# Patient Record
Sex: Male | Born: 1980 | Race: Black or African American | Hispanic: No | Marital: Married | Smoking: Current every day smoker
Health system: Southern US, Community
[De-identification: ages and names within clinical notes are randomized; demographics above are authoritative.]

## PROBLEM LIST (undated history)

## (undated) DIAGNOSIS — G35 Multiple sclerosis: Secondary | ICD-10-CM

---

## 2014-02-10 ENCOUNTER — Encounter: Payer: Self-pay | Admitting: Neurology

## 2014-02-10 ENCOUNTER — Ambulatory Visit (INDEPENDENT_AMBULATORY_CARE_PROVIDER_SITE_OTHER): Payer: 59 | Admitting: Neurology

## 2014-02-10 VITALS — BP 130/76 | HR 90 | Ht 76.0 in | Wt 207.7 lb

## 2014-02-10 DIAGNOSIS — R209 Unspecified disturbances of skin sensation: Secondary | ICD-10-CM

## 2014-02-10 DIAGNOSIS — R200 Anesthesia of skin: Secondary | ICD-10-CM

## 2014-02-10 DIAGNOSIS — R29818 Other symptoms and signs involving the nervous system: Secondary | ICD-10-CM | POA: Insufficient documentation

## 2014-02-10 LAB — COMPREHENSIVE METABOLIC PANEL
ALK PHOS: 79 U/L (ref 39–117)
ALT: 18 U/L (ref 0–53)
AST: 20 U/L (ref 0–37)
Albumin: 4.6 g/dL (ref 3.5–5.2)
BUN: 14 mg/dL (ref 6–23)
CO2: 26 mEq/L (ref 19–32)
Calcium: 9.3 mg/dL (ref 8.4–10.5)
Chloride: 101 mEq/L (ref 96–112)
Creat: 0.9 mg/dL (ref 0.50–1.35)
Glucose, Bld: 90 mg/dL (ref 70–99)
Potassium: 4.4 mEq/L (ref 3.5–5.3)
SODIUM: 137 meq/L (ref 135–145)
TOTAL PROTEIN: 7.3 g/dL (ref 6.0–8.3)
Total Bilirubin: 0.9 mg/dL (ref 0.2–1.2)

## 2014-02-10 LAB — CBC
HEMATOCRIT: 40 % (ref 39.0–52.0)
Hemoglobin: 14.4 g/dL (ref 13.0–17.0)
MCH: 30.8 pg (ref 26.0–34.0)
MCHC: 36 g/dL (ref 30.0–36.0)
MCV: 85.5 fL (ref 78.0–100.0)
PLATELETS: 196 10*3/uL (ref 150–400)
RBC: 4.68 MIL/uL (ref 4.22–5.81)
RDW: 13.3 % (ref 11.5–15.5)
WBC: 4.7 10*3/uL (ref 4.0–10.5)

## 2014-02-10 LAB — SEDIMENTATION RATE: Sed Rate: 1 mm/hr (ref 0–16)

## 2014-02-10 LAB — VITAMIN B12: Vitamin B-12: 868 pg/mL (ref 211–911)

## 2014-02-10 LAB — TSH: TSH: 2.942 u[IU]/mL (ref 0.350–4.500)

## 2014-02-10 NOTE — Progress Notes (Signed)
NEUROLOGY CONSULTATION NOTE  Travis Castillo MRN: [id number] DOB: Dec 05, 1980  Referring provider: Dr. Briscoe Deutscher Primary care provider: Dr. Melinda Crutch  Reason for consult:  Lhermitte's sign  Dear Dr Maceo Pro:  Thank you for your kind referral of Travis Castillo for consultation of the above symptoms. Although his history is well known to you, please allow me to reiterate it for the purpose of our medical record. Records and images were personally reviewed where available.  HISTORY OF PRESENT ILLNESS: This is a very pleasant 33 year old left-handed man with no significant past medical history who is very active in sports, presenting for evaluation of possible Lhermitte's sign and numbness.  Symptoms started around 2 months ago when he had numbness in the fingers of his right hand. After a week, it spread to his entire hand, then a week later, he noticed the numbness in his left hand.  The episode lasted 2 weeks.  Around a month ago, he noticed that every time he would flex his neck, he feels a sensation of vibration radiating from his neck down his back all the way to his feet.  He denies any neck pain, but describes a vibrating sensation in his neck and in his thighs.  Symptoms resolve when he lifts his head back to neutral position.  He has occasional numbness his toes.  He notes that over the past 3 months, he would get numbness in both legs when bending forward while sitting, which he attributed to positional changes.  No facial involvement.  He denies similar symptoms in the past.  He denies any vision changes/loss of vision in the past.  He is very active with basketball, weights, and cardio. He noticed that recently when he does push-ups, his bones feel a little more sore.  He denies any headaches, dizziness, blurred vision, diplopia, dysarthria, dysphagia, neck/back pain, bowel/bladder dysfunction, perineal numbness.  He denies any family history of similar symptoms.  He had a mild cold 4 months  ago but no recent infections, immunizations, or travels.  He had a cervical xray done on 01/30/14 which was normal.  PAST MEDICAL HISTORY: History reviewed. No pertinent past medical history.  PAST SURGICAL HISTORY: History reviewed. No pertinent past surgical history.  MEDICATIONS: No current outpatient prescriptions on file prior to visit.   No current facility-administered medications on file prior to visit.    ALLERGIES: No Known Allergies  FAMILY HISTORY: History reviewed. No pertinent family history.  SOCIAL HISTORY: History   Social History  . Marital Status: Married    Spouse Name: N/A    Number of Children: N/A  . Years of Education: N/A   Occupational History  . Not on file.   Social History Main Topics  . Smoking status: Former Smoker    Types: Cigarettes  . Smokeless tobacco: Not on file  . Alcohol Use: Yes     Comment: social  . Drug Use: No  . Sexual Activity: Not on file   Other Topics Concern  . Not on file   Social History Narrative  . No narrative on file    REVIEW OF SYSTEMS: Constitutional: No fevers, chills, or sweats, no generalized fatigue, change in appetite Eyes: No visual changes, double vision, eye pain Ear, nose and throat: No hearing loss, ear pain, nasal congestion, sore throat Cardiovascular: No chest pain, palpitations Respiratory:  No shortness of breath at rest or with exertion, wheezes GastrointestinaI: No nausea, vomiting, diarrhea, abdominal pain, fecal incontinence Genitourinary:  No dysuria, urinary  retention or frequency Musculoskeletal:  No neck pain, back pain Integumentary: No rash, pruritus, skin lesions Neurological: as above Psychiatric: No depression, insomnia, anxiety Endocrine: No palpitations, fatigue, diaphoresis, mood swings, change in appetite, change in weight, increased thirst Hematologic/Lymphatic:  No anemia, purpura, petechiae. Allergic/Immunologic: no itchy/runny eyes, nasal congestion, recent  allergic reactions, rashes  PHYSICAL EXAM: Filed Vitals:   02/10/14 0951  BP: 130/76  Pulse: 90   General: No acute distress Head:  Normocephalic/atraumatic Eyes: Fundoscopic exam shows bilateral sharp discs, no vessel changes, exudates, or hemorrhages Neck: supple, no paraspinal tenderness, full range of motion. Reproducible sensation down his spine with neck flexion Back: No paraspinal tenderness Heart: regular rate and rhythm Lungs: Clear to auscultation bilaterally. Vascular: No carotid bruits. Skin/Extremities: No rash, no edema Neurological Exam: Mental status: alert and oriented to person, place, and time, no dysarthria or aphasia, Fund of knowledge is appropriate.  Recent and remote memory are intact.  Attention and concentration are normal.    Able to name objects and repeat phrases. Cranial nerves: CN I: not tested CN II: pupils equal, round and reactive to light, visual fields intact, fundi unremarkable. CN III, IV, VI:  full range of motion, no nystagmus, no ptosis CN V: facial sensation intact CN VII: upper and lower face symmetric CN VIII: hearing intact to finger rub CN IX, X: gag intact, uvula midline CN XI: sternocleidomastoid and trapezius muscles intact CN XII: tongue midline Bulk & Tone: normal, no fasciculations. Motor: 5/5 throughout with no pronator drift. Sensation: intact to light touch, cold, pin, vibration and joint position sense.  No extinction to double simultaneous stimulation.  Romberg test negative Deep Tendon Reflexes: +2 throughout, no ankle clonus, negative Hoffman's sign Plantar responses: downgoing bilaterally Cerebellar: no incoordination on finger to nose, heel to shin. No dysdiadochokinesia Gait: narrow-based and steady, able to tandem walk adequately. Tremor: none  IMPRESSION: This is a 33 year old left-handed man with no significant past medication history, presenting with paresthesias that lasted for 2 weeks 2 months ago, now with  symptoms suggestive of Lhermitte's sign for the past month.  His neurological exam is normal.  The etiology of his symptoms is unclear, but at this age concerning for demyelinating disease.  MRI brain and cervical spine with and without contrast will be ordered to assess for underlying structural abnormality.  Bloodwork for CBC, CMP, ESR, ANA, TSH, B12, ACE will be ordered.  He knows to call our office for any change in symptoms and will follow-up after the tests.    Thank you for allowing me to participate in the care of this patient. Please do not hesitate to call for any questions or concerns.   Ellouise Newer, M.D.  CC: Dr. Harrington Challenger

## 2014-02-10 NOTE — Patient Instructions (Signed)
1. MRI brain with and without contrast 2. MRI cervical spine with and without contrast 3. Bloodwork for CBC, CMP, ESR, ANA, TSH, B12, ACE level 4. Follow-up after tests

## 2014-02-11 LAB — ANA: Anti Nuclear Antibody(ANA): NEGATIVE

## 2014-02-11 LAB — ANGIOTENSIN CONVERTING ENZYME: Angiotensin-Converting Enzyme: 23 U/L (ref 8–52)

## 2014-03-03 ENCOUNTER — Ambulatory Visit (HOSPITAL_COMMUNITY): Payer: 59

## 2014-03-03 ENCOUNTER — Ambulatory Visit (HOSPITAL_COMMUNITY)
Admission: RE | Admit: 2014-03-03 | Discharge: 2014-03-03 | Disposition: A | Discharge status: Home / Self Care | Payer: 59 | Source: Ambulatory Visit | Attending: Neurology | Admitting: Neurology

## 2014-03-03 DIAGNOSIS — R29818 Other symptoms and signs involving the nervous system: Secondary | ICD-10-CM

## 2014-03-03 DIAGNOSIS — G379 Demyelinating disease of central nervous system, unspecified: Secondary | ICD-10-CM | POA: Insufficient documentation

## 2014-03-03 DIAGNOSIS — R209 Unspecified disturbances of skin sensation: Secondary | ICD-10-CM | POA: Diagnosis present

## 2014-03-03 DIAGNOSIS — R200 Anesthesia of skin: Secondary | ICD-10-CM

## 2014-03-03 MED ORDER — GADOBENATE DIMEGLUMINE 529 MG/ML IV SOLN
20.0000 mL | Freq: Once | INTRAVENOUS | Status: AC
  Administered 2014-03-03: 20 mL via INTRAVENOUS

## 2014-03-04 ENCOUNTER — Telehealth: Payer: Self-pay | Admitting: *Deleted

## 2014-03-04 NOTE — Telephone Encounter (Signed)
Patient has been scheduled for Friday 31@ 1pm

## 2014-03-04 NOTE — Telephone Encounter (Signed)
Message copied by Samantha Crimes on Thu Mar 04, 2014  8:25 AM ------      Message from: Van Clines      Created: Thu Mar 04, 2014  8:09 AM      Regarding: pls schedule appt       Pls let patient know I would like to review his MRI scan with him personally, I can see him tomorrow either 1pm or 3:30pm. I will be out of the office the whole next week. Thanks ------

## 2014-03-05 ENCOUNTER — Encounter: Payer: Self-pay | Admitting: Neurology

## 2014-03-05 ENCOUNTER — Ambulatory Visit (INDEPENDENT_AMBULATORY_CARE_PROVIDER_SITE_OTHER): Payer: 59 | Admitting: Neurology

## 2014-03-05 VITALS — BP 130/70 | HR 79 | Ht 76.0 in | Wt 208.7 lb

## 2014-03-05 DIAGNOSIS — G379 Demyelinating disease of central nervous system, unspecified: Secondary | ICD-10-CM

## 2014-03-05 LAB — CBC WITH DIFFERENTIAL/PLATELET
BASOS ABS: 0.1 10*3/uL (ref 0.0–0.1)
BASOS PCT: 1 % (ref 0–1)
Eosinophils Absolute: 0.2 10*3/uL (ref 0.0–0.7)
Eosinophils Relative: 3 % (ref 0–5)
HCT: 42.3 % (ref 39.0–52.0)
Hemoglobin: 14.4 g/dL (ref 13.0–17.0)
Lymphocytes Relative: 54 % — ABNORMAL HIGH (ref 12–46)
Lymphs Abs: 2.9 10*3/uL (ref 0.7–4.0)
MCH: 30.6 pg (ref 26.0–34.0)
MCHC: 34 g/dL (ref 30.0–36.0)
MCV: 89.8 fL (ref 78.0–100.0)
Monocytes Absolute: 0.4 10*3/uL (ref 0.1–1.0)
Monocytes Relative: 8 % (ref 3–12)
NEUTROS PCT: 34 % — AB (ref 43–77)
Neutro Abs: 1.8 10*3/uL (ref 1.7–7.7)
PLATELETS: 216 10*3/uL (ref 150–400)
RBC: 4.71 MIL/uL (ref 4.22–5.81)
RDW: 13.2 % (ref 11.5–15.5)
WBC: 5.3 10*3/uL (ref 4.0–10.5)

## 2014-03-05 NOTE — Patient Instructions (Signed)
1. Bloodwork for PT/INR, VZV Ab, HIV, vitamin D, CBC with differential 2. Schedule Lumbar Puncture 3. Schedule thoracic MRI with and without contrast 4. Call our office for any change in symptoms

## 2014-03-05 NOTE — Progress Notes (Signed)
NEUROLOGY FOLLOW UP OFFICE NOTE  Travis Castillo [id number]  HISTORY OF PRESENT ILLNESS: I had the pleasure of seeing Travis Castillo in follow-up in the neurology clinic on 03/05/2014.  The patient was last seen 3 weeks ago when he presented with symptoms suggestive of Lhermitte's sign.  MRI brain and C-spine were done which I personally reviewed.  His MRI brain with and without contrast shows extensive periventricular and scattered subcortical white matter changes bilaterally. There is asymmetric white matter disease adjacent to the atrium of the left lateral ventricle.  There is extensive involvement of the colossoseptal margin. There is some diffusion signal abnormality within the area involving the right coronal radiata. A 7 x 6 x 2 mm focus of enhancement is associated with a white matter lesion in the anterior left frontal lobe. There are 2 punctate areas of enhancement associated with the area of signal abnormality in the right coronal radiata. MRI C-spine was normal, no abnormal signal or enhancement seen.  He presents today to discuss MRI findings.  Since his last visit, he continues to have positive Lhermitte's sign. Over the past 2 weeks, he has had intermittent cold sensation in his left foot, but when he touches it, foot is warm.  He denies any numbness, he notices it mostly in the sole of his foot when he steps on carpet. No focal weakness. He denies any vision changes, no loss of vision.  HPI:  This is a very pleasant 33 yo LH man with no significant past medical history who is very active in sports, who presented for evaluation of possible Lhermitte's sign and numbness. Symptoms started around May 2015 when he had numbness in the fingers of his right hand. After a week, it spread to his entire hand, then a week later, he noticed the numbness in his left hand. The episode lasted 2 weeks. Around June, he noticed that every time he would flex his neck, he feels a sensation of vibration  radiating from his neck down his back all the way to his feet. He denies any neck pain, but describes a vibrating sensation in his neck and in his thighs. Symptoms resolve when he lifts his head back to neutral position. He has occasional numbness his toes. He would get numbness in both legs when bending forward while sitting, which he attributed to positional changes. No facial involvement. He denies similar symptoms in the past. He denies any vision changes/loss of vision in the past. He is very active with basketball, weights, and cardio. He noticed that recently when he does push-ups, his bones feel a little more sore.   He denies any headaches, dizziness, blurred vision, diplopia, dysarthria, dysphagia, neck/back pain, bowel/bladder dysfunction, perineal numbness. He denies any family history of similar symptoms. He had a mild cold 4 months ago but no recent infections, immunizations, or travels.   Laboratory Data: Component     Latest Ref Rng 02/10/2014  Sodium     135 - 145 mEq/L 137  Potassium     3.5 - 5.3 mEq/L 4.4  Chloride     96 - 112 mEq/L 101  CO2     19 - 32 mEq/L 26  Glucose     70 - 99 mg/dL 90  BUN     6 - 23 mg/dL 14  Creatinine     4.090.50 - 1.35 mg/dL 8.110.90  Total Bilirubin     0.2 - 1.2 mg/dL 0.9  Alkaline Phosphatase     39 - 117  U/L 79  AST     0 - 37 U/L 20  ALT     0 - 53 U/L 18  Total Protein     6.0 - 8.3 g/dL 7.3  Albumin     3.5 - 5.2 g/dL 4.6  Calcium     8.4 - 10.5 mg/dL 9.3  WBC     4.0 - 16.1 K/uL 4.7  RBC     4.22 - 5.81 MIL/uL 4.68  Hemoglobin     13.0 - 17.0 g/dL 09.6  HCT     04.5 - 40.9 % 40.0  MCV     78.0 - 100.0 fL 85.5  MCH     26.0 - 34.0 pg 30.8  MCHC     30.0 - 36.0 g/dL 81.1  RDW     91.4 - 78.2 % 13.3  Platelets     150 - 400 K/uL 196  ANA     NEGATIVE NEG  TSH     0.350 - 4.500 uIU/mL 2.942  Vitamin B-12     211 - 911 pg/mL 868  Sed Rate     0 - 16 mm/hr 1  Angiotensin-Converting Enzyme     8 - 52 U/L 23     PAST MEDICAL HISTORY: History reviewed. No pertinent past medical history.  MEDICATIONS: No current outpatient prescriptions on file prior to visit.   No current facility-administered medications on file prior to visit.    ALLERGIES: No Known Allergies  FAMILY HISTORY: History reviewed. No pertinent family history.  SOCIAL HISTORY: History   Social History  . Marital Status: Married    Spouse Name: N/A    Number of Children: N/A  . Years of Education: N/A   Occupational History  . Not on file.   Social History Main Topics  . Smoking status: Former Smoker    Types: Cigarettes  . Smokeless tobacco: Not on file  . Alcohol Use: Yes     Comment: social  . Drug Use: No  . Sexual Activity: Not on file   Other Topics Concern  . Not on file   Social History Narrative  . No narrative on file    REVIEW OF SYSTEMS: Constitutional: No fevers, chills, or sweats, no generalized fatigue, change in appetite Eyes: No visual changes, double vision, eye pain Ear, nose and throat: No hearing loss, ear pain, nasal congestion, sore throat Cardiovascular: No chest pain, palpitations Respiratory:  No shortness of breath at rest or with exertion, wheezes GastrointestinaI: No nausea, vomiting, diarrhea, abdominal pain, fecal incontinence Genitourinary:  No dysuria, urinary retention or frequency Musculoskeletal:  No neck pain, back pain Integumentary: No rash, pruritus, skin lesions Neurological: as above Psychiatric: No depression, insomnia, anxiety Endocrine: No palpitations, fatigue, diaphoresis, mood swings, change in appetite, change in weight, increased thirst Hematologic/Lymphatic:  No anemia, purpura, petechiae. Allergic/Immunologic: no itchy/runny eyes, nasal congestion, recent allergic reactions, rashes  PHYSICAL EXAM: Filed Vitals:   03/05/14 1313  BP: 130/70  Pulse: 79   General: No acute distress Head:  Normocephalic/atraumatic Neck: supple, no paraspinal  tenderness, full range of motion Heart:  Regular rate and rhythm Lungs:  Clear to auscultation bilaterally Back: No paraspinal tenderness Skin/Extremities: No rash, no edema Neurological Exam: alert and oriented to person, place, and time. No aphasia or dysarthria. Fund of knowledge is appropriate.  Recent and remote memory are intact.  Attention and concentration are normal.    Able to name objects and repeat phrases. Cranial nerves: Pupils equal, round,  reactive to light.  Fundoscopic exam unremarkable, no papilledema. Extraocular movements intact with no nystagmus. Visual fields full. Facial sensation intact. No facial asymmetry. Tongue, uvula, palate midline.  Motor: Bulk and tone normal, muscle strength 5/5 throughout with no pronator drift.  Sensation to light touch, temperature and vibration intact.  No extinction to double simultaneous stimulation.  Deep tendon reflexes 2+ throughout, toes downgoing.  Finger to nose testing intact.  Gait narrow-based and steady, able to tandem walk adequately.  Romberg negative.  IMPRESSION: This is a very pleasant 33 yo LH man with no significant past medication history, who presented with paresthesias that lasted for 2 weeks last May 2015, then had symptoms suggestive of Lhermitte's sign since June.  His neurological exam is normal.  His MRI brain shows extensive white matter changes bilaterally and in the callososeptal region, with small foci of abnormal enhancement in the left frontal and right corona radiata, concerning for demyelinating disease.  MRI C-spine normal. We had an extensive discussion regarding the MRI findings, further evaluation with MRI T-spine for spinal cord involvement, lumbar puncture to send for oligoclonal bands, CSF ACE, VDRL, HIV, and if T-spine abnormal will send for NMO Ab. Bloodwork for CBC with differential, vitamin D, HIV, VZV Ab, PT/INR will be ordered. He will be referred for baseline ophthalmology exam. We discussed multiple  sclerosis and treatment options. He was given information regarding the different medications and after LP and MRI T-spine done, we will plan to start medication.  The foci of abnormal enhancement are punctate and small at this time, his exam is normal, hold off on IV steroids for now. He knows to call our office for any change in symptoms, we discussed the use of IV steroids for flares.  He expressed understanding and will follow-up in 1 month.  Thank you for allowing me to participate in his care.  Please do not hesitate to call for any questions or concerns.  The duration of this appointment visit was 15 minutes of face-to-face time with the patient.  Greater than 50% of this time was spent in counseling, explanation of diagnosis, planning of further management, and coordination of care.   Patrcia Dolly, M.D.   CC: Dr. Tenny Craw

## 2014-03-06 LAB — PROTIME-INR
INR: 1.03 (ref <1.50)
PROTHROMBIN TIME: 13.5 s (ref 11.6–15.2)

## 2014-03-06 LAB — HIV ANTIBODY (ROUTINE TESTING W REFLEX): HIV: NONREACTIVE

## 2014-03-06 LAB — VITAMIN D 25 HYDROXY (VIT D DEFICIENCY, FRACTURES): VIT D 25 HYDROXY: 40 ng/mL (ref 30–89)

## 2014-03-06 LAB — VARICELLA ZOSTER ANTIBODY, IGG: Varicella IgG: 2456 Index — ABNORMAL HIGH (ref <135.00)

## 2014-03-09 ENCOUNTER — Encounter: Payer: Self-pay | Admitting: Neurology

## 2014-03-09 ENCOUNTER — Other Ambulatory Visit: Payer: Self-pay | Admitting: *Deleted

## 2014-03-09 DIAGNOSIS — G379 Demyelinating disease of central nervous system, unspecified: Secondary | ICD-10-CM

## 2014-03-12 ENCOUNTER — Ambulatory Visit (HOSPITAL_COMMUNITY)
Admission: RE | Admit: 2014-03-12 | Discharge: 2014-03-12 | Disposition: A | Discharge status: Home / Self Care | Payer: 59 | Source: Ambulatory Visit | Attending: Neurology | Admitting: Neurology

## 2014-03-12 DIAGNOSIS — G379 Demyelinating disease of central nervous system, unspecified: Secondary | ICD-10-CM

## 2014-03-12 DIAGNOSIS — M542 Cervicalgia: Secondary | ICD-10-CM | POA: Insufficient documentation

## 2014-03-12 DIAGNOSIS — R209 Unspecified disturbances of skin sensation: Secondary | ICD-10-CM | POA: Insufficient documentation

## 2014-03-12 DIAGNOSIS — M546 Pain in thoracic spine: Secondary | ICD-10-CM | POA: Insufficient documentation

## 2014-03-12 MED ORDER — GADOBENATE DIMEGLUMINE 529 MG/ML IV SOLN
20.0000 mL | Freq: Once | INTRAVENOUS | Status: AC | PRN
  Administered 2014-03-12: 20 mL via INTRAVENOUS

## 2014-03-15 ENCOUNTER — Other Ambulatory Visit: Payer: Self-pay | Admitting: *Deleted

## 2014-03-15 ENCOUNTER — Ambulatory Visit: Payer: 59 | Admitting: Neurology

## 2014-03-15 DIAGNOSIS — G35 Multiple sclerosis: Secondary | ICD-10-CM

## 2014-03-17 ENCOUNTER — Other Ambulatory Visit: Payer: Self-pay | Admitting: *Deleted

## 2014-03-17 DIAGNOSIS — G35 Multiple sclerosis: Secondary | ICD-10-CM

## 2014-03-26 DIAGNOSIS — Z9852 Vasectomy status: Secondary | ICD-10-CM | POA: Insufficient documentation

## 2014-04-05 ENCOUNTER — Telehealth: Payer: Self-pay | Admitting: Family Medicine

## 2014-04-05 NOTE — Telephone Encounter (Signed)
Patient returned my call. Needed to know if he has scheduled LP, he states that he has not. I told him that he needed to call [facility] to have that scheduled and Dr. Karel Jarvis wanted to see him after he has had LP. He does have a f/u scheduled for 9/2 which I will cancel. Told patient that we needed to see him about a week after he has LP. I gave him the # to [facility]. He states that he wanted to have LP done and he will call to schedule. Orders have already been placed and are in EPIC.

## 2014-04-07 ENCOUNTER — Ambulatory Visit: Payer: 59 | Admitting: Neurology

## 2014-04-09 ENCOUNTER — Ambulatory Visit
Admission: RE | Admit: 2014-04-09 | Discharge: 2014-04-09 | Disposition: A | Discharge status: Home / Self Care | Payer: 59 | Source: Ambulatory Visit | Attending: Neurology | Admitting: Neurology

## 2014-04-09 ENCOUNTER — Other Ambulatory Visit (HOSPITAL_COMMUNITY)
Admission: RE | Admit: 2014-04-09 | Discharge: 2014-04-09 | Disposition: A | Discharge status: Home / Self Care | Payer: 59 | Source: Ambulatory Visit | Attending: Neurology | Admitting: Neurology

## 2014-04-09 VITALS — BP 120/71 | HR 73

## 2014-04-09 DIAGNOSIS — G35 Multiple sclerosis: Secondary | ICD-10-CM

## 2014-04-09 LAB — GRAM STAIN

## 2014-04-09 LAB — GLUCOSE, CSF: Glucose, CSF: 70 mg/dL (ref 43–76)

## 2014-04-09 LAB — PROTEIN, CSF: TOTAL PROTEIN, CSF: 22 mg/dL (ref 15–45)

## 2014-04-09 LAB — CSF CELL COUNT WITH DIFFERENTIAL
RBC COUNT CSF: 6 /mm3 — AB
Tube #: 3
WBC, CSF: 2 /mm3 (ref 0–5)

## 2014-04-09 NOTE — Discharge Instructions (Signed)

## 2014-04-09 NOTE — Progress Notes (Signed)
Two tiger-topped tubes of blood drawn from right [facility] space without difficulty for LP labs; site unremarkable.  jkl

## 2014-04-13 ENCOUNTER — Other Ambulatory Visit: Payer: Self-pay | Admitting: Family Medicine

## 2014-04-13 ENCOUNTER — Telehealth: Payer: Self-pay

## 2014-04-13 DIAGNOSIS — G971 Other reaction to spinal and lumbar puncture: Secondary | ICD-10-CM

## 2014-04-13 LAB — CSF CULTURE

## 2014-04-13 LAB — CSF CULTURE W GRAM STAIN: Culture: NO GROWTH

## 2014-04-13 LAB — ANGIOTENSIN CONVERTING ENZYME, CSF: ANGIO CONVERT ENZYME: 10 U/L (ref <=15)

## 2014-04-13 LAB — VDRL, CSF: SYPHILIS VDRL QUANT CSF: NONREACTIVE

## 2014-04-13 NOTE — Telephone Encounter (Signed)
Patient called with complaint of positional headache since recovering from the 24 hours of strict bedrest after LP here 04/09/14.  We discussed epidural blood patch versus continued bedrest with forcing fluids.  We decided I would obtain order for blood patch and call him tomorrow to schedule the procedure for next week (his wife is due to deliver a baby any day), and he would do the bedrest and fluids and tolerate headache and/or hope it goes away before needing the blood patch.  jkl

## 2014-04-14 LAB — CSF IGG: IgG, CSF: 6.3 mg/dL (ref 0.8–7.7)

## 2014-04-15 LAB — VARICELLA-ZOSTER BY PCR: Varicella-Zoster, PCR: NOT DETECTED

## 2014-04-16 LAB — OLIGOCLONAL BANDS, CSF + SERM

## 2014-04-17 LAB — HSV(HERPES SMPLX VRS)ABS-I+II(IGG)-CSF

## 2014-04-19 ENCOUNTER — Telehealth: Payer: Self-pay | Admitting: Family Medicine

## 2014-04-19 NOTE — Telephone Encounter (Signed)
Called patient to make aware of f/u appt needed to discuss LP results. Appt made for Thursday (9/17) @ 8:30 am.

## 2014-04-19 NOTE — Telephone Encounter (Signed)
Message copied by Franciso Bend on Mon Apr 19, 2014 11:59 AM ------      Message from: Van Clines      Created: Mon Apr 19, 2014 10:22 AM      Regarding: pls sched f/u       Pls let him know to schedule f/u to discuss results, thanks ------

## 2014-04-22 ENCOUNTER — Encounter: Payer: Self-pay | Admitting: Neurology

## 2014-04-22 ENCOUNTER — Ambulatory Visit (INDEPENDENT_AMBULATORY_CARE_PROVIDER_SITE_OTHER): Payer: 59 | Admitting: Neurology

## 2014-04-22 VITALS — BP 138/80 | HR 86 | Resp 18 | Ht 76.0 in | Wt 207.0 lb

## 2014-04-22 DIAGNOSIS — G35 Multiple sclerosis: Secondary | ICD-10-CM | POA: Insufficient documentation

## 2014-04-22 NOTE — Progress Notes (Signed)
NEUROLOGY FOLLOW UP OFFICE NOTE  Travis Castillo [id number]  HISTORY OF PRESENT ILLNESS: I had the pleasure of seeing Travis Castillo in follow-up in the neurology clinic on 04/22/2014.  The patient was last seen 2 months ago during which we discussed MRI brain showing findings concerning for MS. MRI C-spine normal.  He had presented with symptoms suggestive of Lhermitte's sign and numbness in his upper extremities.  He reports the sensation going down his spine and the numbness have not been noticeable in the past 2 weeks. No new symptoms. He denies any headaches, fatigue, vision changes, weakness.  In the interim, he had an MRI thoracic spine which I personally reviewed that is normal. He had a lumbar puncture which showed >5 oligoclonal bands. All other studies negative as noted below.  He did have a post-LP headache for several days but did not proceed with blood patch.  HPI:  This is a very pleasant 33 yo LH man with no significant past medical history who is very active in sports, who presented for evaluation of possible Lhermitte's sign and numbness. Symptoms started around May 2015 when he had numbness in the fingers of his right hand. After a week, it spread to his entire hand, then a week later, he noticed the numbness in his left hand. The episode lasted 2 weeks. Around June, he noticed that every time he would flex his neck, he feels a sensation of vibration radiating from his neck down his back all the way to his feet. He denies any neck pain, but describes a vibrating sensation in his neck and in his thighs. Symptoms resolve when he lifts his head back to neutral position. He has occasional numbness his toes. He would get numbness in both legs when bending forward while sitting, which he attributed to positional changes. No facial involvement. He denies similar symptoms in the past. He denies any vision changes/loss of vision in the past. He is very active with basketball, weights, and cardio. He  noticed that recently when he does push-ups, his bones feel a little more sore.   Diagnostic Data: MRI brain with and without contrast shows extensive periventricular and scattered subcortical white matter changes bilaterally. There is asymmetric white matter disease adjacent to the atrium of the left lateral ventricle. There is extensive involvement of the colossoseptal margin. There is some diffusion signal abnormality within the area involving the right coronal radiata. A 7 x 6 x 2 mm focus of enhancement is associated with a white matter lesion in the anterior left frontal lobe. There are 2 punctate areas of enhancement associated with the area of signal abnormality in the right coronal radiata. MRI C-spine and T-spin normal, no abnormal signal or enhancement seen.   Laboratory Data: CSF showed WBC 2, RBC 6, protein 22, glucose 70.  >5 oligoclonal bands Normal IgG index 6.3 VDRL nonreactive VZV PCR, HSV, ACE negative Gram stain, culture, fungal culture negative Component     Latest Ref Rng 02/10/2014 03/05/2014  WBC     4.0 - 10.5 K/uL  5.3  RBC     4.22 - 5.81 MIL/uL  4.71  Hemoglobin     13.0 - 17.0 g/dL  14.4  HCT     39.0 - 52.0 %  42.3  MCV     78.0 - 100.0 fL  89.8  MCH     26.0 - 34.0 pg  30.6  MCHC     30.0 - 36.0 g/dL  34.0  RDW  11.5 - 15.5 %  13.2  Platelets     150 - 400 K/uL  216  Neutrophils Relative %     43 - 77 %  34 (L)  NEUT#     1.7 - 7.7 K/uL  1.8  Lymphocytes Relative     12 - 46 %  54 (H)  Lymphocytes Absolute     0.7 - 4.0 K/uL  2.9  Monocytes Relative     3 - 12 %  8  Monocytes Absolute     0.1 - 1.0 K/uL  0.4  Eosinophils Relative     0 - 5 %  3  Eosinophils Absolute     0.0 - 0.7 K/uL  0.2  Basophils Relative     0 - 1 %  1  Basophils Absolute     0.0 - 0.1 K/uL  0.1  Smear Review       Criteria for review not met  Sodium     135 - 145 mEq/L 137   Potassium     3.5 - 5.3 mEq/L 4.4   Chloride     96 - 112 mEq/L 101   CO2     19  - 32 mEq/L 26   Glucose     70 - 99 mg/dL 90   BUN     6 - 23 mg/dL 14   Creatinine     0.50 - 1.35 mg/dL 0.90   Total Bilirubin     0.2 - 1.2 mg/dL 0.9   Alkaline Phosphatase     39 - 117 U/L 79   AST     0 - 37 U/L 20   ALT     0 - 53 U/L 18   Total Protein     6.0 - 8.3 g/dL 7.3   Albumin     3.5 - 5.2 g/dL 4.6   Calcium     8.4 - 10.5 mg/dL 9.3   ANA     NEGATIVE NEG   TSH     0.350 - 4.500 uIU/mL 2.942   Vitamin B-12     211 - 911 pg/mL 868   Sed Rate     0 - 16 mm/hr 1   Angiotensin-Converting Enzyme     8 - 52 U/L 23   Varicella     <135.00 Index  2456.00 (H)  HIV     NONREACTIVE  NONREACTIVE  Vit D, 25-Hydroxy     30 - 89 ng/mL  40   PAST MEDICAL HISTORY: No past medical history on file.  MEDICATIONS: No current outpatient prescriptions on file prior to visit.   No current facility-administered medications on file prior to visit.    ALLERGIES: No Known Allergies  FAMILY HISTORY: No family history on file.  SOCIAL HISTORY: History   Social History  . Marital Status: Married    Spouse Name: N/A    Number of Children: N/A  . Years of Education: N/A   Occupational History  . Not on file.   Social History Main Topics  . Smoking status: Former Smoker    Types: Cigarettes  . Smokeless tobacco: Not on file  . Alcohol Use: Yes     Comment: social  . Drug Use: No  . Sexual Activity: Not on file   Other Topics Concern  . Not on file   Social History Narrative  . No narrative on file    REVIEW OF SYSTEMS: Constitutional: No fevers, chills, or sweats, no  generalized fatigue, change in appetite Eyes: No visual changes, double vision, eye pain Ear, nose and throat: No hearing loss, ear pain, nasal congestion, sore throat Cardiovascular: No chest pain, palpitations Respiratory:  No shortness of breath at rest or with exertion, wheezes GastrointestinaI: No nausea, vomiting, diarrhea, abdominal pain, fecal incontinence Genitourinary:  No  dysuria, urinary retention or frequency Musculoskeletal:  No neck pain, back pain Integumentary: No rash, pruritus, skin lesions Neurological: as above Psychiatric: No depression, insomnia, anxiety Endocrine: No palpitations, fatigue, diaphoresis, mood swings, change in appetite, change in weight, increased thirst Hematologic/Lymphatic:  No anemia, purpura, petechiae. Allergic/Immunologic: no itchy/runny eyes, nasal congestion, recent allergic reactions, rashes  PHYSICAL EXAM: Filed Vitals:   04/22/14 0825  BP: 138/80  Pulse: 86  Resp: 18   General: No acute distress Head:  Normocephalic/atraumatic Neck: supple, no paraspinal tenderness, full range of motion Heart:  Regular rate and rhythm Lungs:  Clear to auscultation bilaterally Back: No paraspinal tenderness Skin/Extremities: No rash, no edema Neurological Exam: alert and oriented to person, place, and time. No aphasia or dysarthria. Fund of knowledge is appropriate.  Recent and remote memory are intact.  Attention and concentration are normal.    Able to name objects and repeat phrases. Cranial nerves: Pupils equal, round, reactive to light.  Fundoscopic exam unremarkable, no papilledema. Extraocular movements intact with no nystagmus. Visual fields full. Facial sensation intact. No facial asymmetry. Tongue, uvula, palate midline.  Motor: Bulk and tone normal, muscle strength 5/5 throughout with no pronator drift.  Sensation to light touch, temperature and vibration intact.  No extinction to double simultaneous stimulation.  Deep tendon reflexes 2+ throughout, toes downgoing.  Finger to nose testing intact.  Gait narrow-based and steady, able to tandem walk adequately.  Romberg negative.  IMPRESSION: This is a very pleasant 33 yo LH man with no significant past medication history, who presented with paresthesias that lasted for 2 weeks last May 2015, then had symptoms suggestive of Lhermitte's sign since June. His neurological exam is  normal. His MRI brain shows extensive white matter changes bilaterally and in the callososeptal region, with small foci of abnormal enhancement in the left frontal and right corona radiata. CSF showed >5 oligoclonal bands.  We discussed that the MRI brain, CSF, and clinical symptoms are consistent with a diagnosis of Multiple Sclerosis.  We discussed diagnosis, pathophysiology, prognosis, and the importance of early treatment with disease-modifying agents.  With the extensive MRI brain findings, I would recommend he start oral DMD such as Tecfidera or Gilenya. We discussed side effects, including low risk for PML. Check serum JCV Ab.  He would like to think about medication and will call us this week regarding his decision.  If he chooses Gilenya, he will need an eye exam and cardiac monitoring with first dose.  He knows to call our office for any changes in symptoms and will follow-up in 1 month.  Thank you for allowing me to participate in his care.  Please do not hesitate to call for any questions or concerns.  The duration of this appointment visit was 25 minutes of face-to-face time with the patient.  Greater than 50% of this time was spent in counseling, explanation of diagnosis, planning of further management, and coordination of care.   Ellouise Newer, M.D.   CC: Dr. Harrington Challenger

## 2014-04-22 NOTE — Patient Instructions (Addendum)
1. Call our office regarding your decision of medication 2. Call our office for any change in symptoms 3. Bloodwork for JC virus 4. Follow-up in 1 month

## 2014-04-26 LAB — B. BURGDORFI ANTIBODIES, CSF: LYME AB: NEGATIVE

## 2014-04-28 ENCOUNTER — Encounter: Payer: Self-pay | Admitting: Neurology

## 2014-05-09 LAB — FUNGUS CULTURE W SMEAR: Fungal Smear: NONE SEEN

## 2014-05-20 ENCOUNTER — Ambulatory Visit: Payer: 59 | Admitting: Neurology

## 2014-05-20 ENCOUNTER — Telehealth: Payer: Self-pay | Admitting: Neurology

## 2014-05-20 NOTE — Telephone Encounter (Signed)
Patient returned call. He has yet to come to a discussion about medication. Patient has a follow up appointment on 05/26/2014 where he will be bringing his wife. Patient hopes to have made one by then

## 2014-05-20 NOTE — Telephone Encounter (Signed)
FYI

## 2014-05-20 NOTE — Telephone Encounter (Signed)
Pls call patient to follow-up on his decision regarding medication, I strongly recommend he start medication as we discussed in the office. Thanks

## 2014-05-20 NOTE — Telephone Encounter (Signed)
Lmom to return my call. 

## 2014-05-20 NOTE — Telephone Encounter (Signed)
Pt called on 05/18/14 to cancel his 05/20/14 f/u appt. Pt did not give reason. Will call later to r/s

## 2014-05-26 ENCOUNTER — Ambulatory Visit: Payer: 59 | Admitting: Neurology

## 2014-05-27 ENCOUNTER — Telehealth: Payer: Self-pay | Admitting: Neurology

## 2014-05-27 NOTE — Telephone Encounter (Signed)
Pt no showed 05/26/14 appt w/ Dr. Karel JarvisAquino. No show letter mailed to pt / Sherri S.

## 2014-07-20 ENCOUNTER — Ambulatory Visit (INDEPENDENT_AMBULATORY_CARE_PROVIDER_SITE_OTHER): Payer: 59 | Admitting: Neurology

## 2014-07-20 ENCOUNTER — Encounter: Payer: Self-pay | Admitting: Neurology

## 2014-07-20 VITALS — BP 124/82 | HR 103 | Resp 16 | Ht 76.0 in | Wt 206.0 lb

## 2014-07-20 DIAGNOSIS — G35 Multiple sclerosis: Secondary | ICD-10-CM

## 2014-07-20 NOTE — Progress Notes (Signed)
NEUROLOGY FOLLOW UP OFFICE NOTE  Travis Castillo Ksiazek [id number]  HISTORY OF PRESENT ILLNESS: I had the pleasure of seeing Travis Castillo Dargis in follow-up in the neurology clinic on 07/20/2014.  The patient was last seen 3 months ago for newly diagnosed multiple sclerosis. We had discussed treatment options, side effects. He had missed his appointment after his wife recently gave birth, and presents today again to discuss treatment options. He denies any symptoms that have lasted more than 24 hours. He has occasional aches and pains, discomfort in random spots such as his left heel or legs. If he leans on his legs for a period of time, he would have numbness in his legs for around 4 minutes, then resolves. He has a hard time standing for prolonged periods. No bowel or bladder dysfunction. He denies any headaches, dizziness, vision changes, focal numbness/tingling, no weakness. Lhermitte's sign which he initially presented with has not recurred in the past 2 months.  HPI: This is a very pleasant 33 yo LH man with no significant past medical history who presented for evaluation of possible Lhermitte's sign and numbness last 02/2014. Symptoms started around May 2015 when he had numbness in the fingers of his right hand. After a week, it spread to his entire hand, then a week later, he noticed the numbness in his left hand. The episode lasted 2 weeks. Around June, he noticed that every time he would flex his neck, he feels a sensation of vibration radiating from his neck down his back all the way to his feet. He denies any neck pain, but describes a vibrating sensation in his neck and in his thighs. Symptoms resolve when he lifts his head back to neutral position. He has occasional numbness his toes. He would get numbness in both legs when bending forward while sitting, which he attributed to positional changes. No facial involvement. He denies similar symptoms in the past. He denies any vision changes/loss of vision in  the past. He is very active with basketball, weights, and cardio. He noticed that recently when he does push-ups, his bones feel a little more sore.   His MRI brain with and without contrast had shown extensive periventricular and scattered subcortical white matter changes bilaterally. There is asymmetric white matter disease adjacent to the atrium of the left lateral ventricle. There is extensive involvement of the colossoseptal margin. There is some diffusion signal abnormality within the area involving the right coronal radiata. A 7 x 6 x 2 mm focus of enhancement is associated with a white matter lesion in the anterior left frontal lobe. There are 2 punctate areas of enhancement associated with the area of signal abnormality in the right coronal radiata. MRI C-spine and T-spine normal, no abnormal signal or enhancement seen. He had a lumbar puncture which showed >5 oligoclonal bands. All other studies negative as noted previously. He did have a post-LP headache for several days but did not proceed with blood patch.  PAST MEDICAL HISTORY: No past medical history on file.  MEDICATIONS: No current outpatient prescriptions on file prior to visit.   No current facility-administered medications on file prior to visit.    ALLERGIES: No Known Allergies  FAMILY HISTORY: No family history on file.  SOCIAL HISTORY: History   Social History  . Marital Status: Married    Spouse Name: N/A    Number of Children: N/A  . Years of Education: N/A   Occupational History  . Not on file.   Social History Main Topics  .  Smoking status: Former Smoker    Types: Cigarettes  . Smokeless tobacco: Not on file  . Alcohol Use: Yes     Comment: social  . Drug Use: No  . Sexual Activity: Not on file   Other Topics Concern  . Not on file   Social History Narrative    REVIEW OF SYSTEMS: Constitutional: No fevers, chills, or sweats, no generalized fatigue, change in appetite Eyes: No visual changes,  double vision, eye pain Ear, nose and throat: No hearing loss, ear pain, nasal congestion, sore throat Cardiovascular: No chest pain, palpitations Respiratory:  No shortness of breath at rest or with exertion, wheezes GastrointestinaI: No nausea, vomiting, diarrhea, abdominal pain, fecal incontinence Genitourinary:  No dysuria, urinary retention or frequency Musculoskeletal:  No neck pain, back pain Integumentary: No rash, pruritus, skin lesions Neurological: as above Psychiatric: No depression, insomnia, anxiety Endocrine: No palpitations, fatigue, diaphoresis, mood swings, change in appetite, change in weight, increased thirst Hematologic/Lymphatic:  No anemia, purpura, petechiae. Allergic/Immunologic: no itchy/runny eyes, nasal congestion, recent allergic reactions, rashes  PHYSICAL EXAM: Filed Vitals:   07/20/14 1009  BP: 124/82  Pulse: 103  Resp: 16   General: No acute distress Head:  Normocephalic/atraumatic Neck: supple, no paraspinal tenderness, full range of motion Heart:  Regular rate and rhythm Lungs:  Clear to auscultation bilaterally Back: No paraspinal tenderness Skin/Extremities: No rash, no edema Neurological Exam: alert and oriented to person, place, and time. No aphasia or dysarthria. Fund of knowledge is appropriate.  Recent and remote memory are intact.  Attention and concentration are normal.    Able to name objects and repeat phrases. Cranial nerves: Pupils equal, round, reactive to light.  Fundoscopic exam unremarkable, no papilledema. Extraocular movements intact with no nystagmus. Visual fields full. Facial sensation intact. No facial asymmetry. Tongue, uvula, palate midline.  Motor: Bulk and tone normal, muscle strength 5/5 throughout with no pronator drift.  Sensation to light touch intact.  No extinction to double simultaneous stimulation.  Deep tendon reflexes 2+ throughout, toes downgoing.  Finger to nose testing intact.  Gait narrow-based and steady, able to  tandem walk adequately.  Romberg negative.  IMPRESSION: This is a very pleasant 33 yo LH man with no significant past medication history, who presented with paresthesias that lasted for 2 weeks that started in May 2015, then had symptoms suggestive of Lhermitte's sign in June. His neurological exam is normal. His MRI brain shows extensive white matter changes bilaterally and in the callososeptal region, with small foci of abnormal enhancement in the left frontal and right corona radiata. CSF showed >5 oligoclonal bands. Findings consistent with a diagnosis of Multiple Sclerosis. He presents today to again discuss treatment options, we have decided to start the process for him to begin Gilenya. Side effects were discussed. He will be referred for an initial ophtho visit and monitor for macular edema. Baseline labs will be ordered. He knows to call our office for any changes in symptoms and will follow-up a month after starting the medication.  Thank you for allowing me to participate in his care.  Please do not hesitate to call for any questions or concerns.  The duration of this appointment visit was 25 minutes of face-to-face time with the patient.  Greater than 50% of this time was spent in counseling, explanation of diagnosis, planning of further management, and coordination of care.   Patrcia Dolly, M.D.   CC: Dr. Tenny Craw

## 2014-07-20 NOTE — Patient Instructions (Signed)
1. We will start process with initiating Gilenya 2. You will need to schedule visit with eye doctor 3. Bloodwork for CBC, CMP 4. Follow-up in 1-2 months

## 2014-07-27 ENCOUNTER — Telehealth: Payer: Self-pay | Admitting: Neurology

## 2014-07-27 NOTE — Telephone Encounter (Signed)
PA form was received. Once Dr. Karel JarvisAquino completes form I will fax back to [facility].

## 2014-07-27 NOTE — Telephone Encounter (Signed)
Please call regarding PA for Gilyenia caps. CB# [phone], ref# [id number] / Sherri S.

## 2014-08-03 LAB — COMPREHENSIVE METABOLIC PANEL
ALT: 16 U/L (ref 0–53)
AST: 16 U/L (ref 0–37)
Albumin: 4.2 g/dL (ref 3.5–5.2)
Alkaline Phosphatase: 79 U/L (ref 39–117)
BILIRUBIN TOTAL: 0.7 mg/dL (ref 0.2–1.2)
BUN: 15 mg/dL (ref 6–23)
CALCIUM: 8.9 mg/dL (ref 8.4–10.5)
CHLORIDE: 101 meq/L (ref 96–112)
CO2: 29 mEq/L (ref 19–32)
CREATININE: 1.09 mg/dL (ref 0.50–1.35)
Glucose, Bld: 178 mg/dL — ABNORMAL HIGH (ref 70–99)
Potassium: 4.4 mEq/L (ref 3.5–5.3)
Sodium: 136 mEq/L (ref 135–145)
Total Protein: 7.2 g/dL (ref 6.0–8.3)

## 2014-08-03 LAB — CBC
HEMATOCRIT: 41.9 % (ref 39.0–52.0)
Hemoglobin: 14.4 g/dL (ref 13.0–17.0)
MCH: 31.1 pg (ref 26.0–34.0)
MCHC: 34.4 g/dL (ref 30.0–36.0)
MCV: 90.5 fL (ref 78.0–100.0)
MPV: 11.6 fL (ref 9.4–12.4)
PLATELETS: 203 10*3/uL (ref 150–400)
RBC: 4.63 MIL/uL (ref 4.22–5.81)
RDW: 12.7 % (ref 11.5–15.5)
WBC: 5.3 10*3/uL (ref 4.0–10.5)

## 2014-09-06 ENCOUNTER — Telehealth: Payer: Self-pay | Admitting: Neurology

## 2014-09-06 NOTE — Telephone Encounter (Signed)
Having insurance coverage difficulties starting Gilenya. Wanted to discuss starting Copaxone with patient. Left VM for pt to call back

## 2014-09-10 NOTE — Telephone Encounter (Signed)
Left VM, would like to start Copaxone. Asked patient to call back so we can proceed. Tiffany, when he calls, can you pls let him know that his insurance would not approve Gilenya, would like to start him on medication and recommend Copaxone. If he agrees, pls fill out Copaxone starter form. Thanks

## 2014-09-13 ENCOUNTER — Ambulatory Visit: Payer: 59 | Admitting: Neurology

## 2014-09-17 NOTE — Telephone Encounter (Signed)
Tried calling patient today to f/u on msg Dr. Karel Jarvis left him on last Friday. Mailbox was full, couldn't leave a msg. Will try again.

## 2014-09-24 ENCOUNTER — Ambulatory Visit: Payer: Self-pay | Admitting: Neurology

## 2014-10-04 ENCOUNTER — Telehealth: Payer: Self-pay | Admitting: Neurology

## 2014-10-04 NOTE — Telephone Encounter (Signed)
Pt no showed 09/24/14 appt w/ Dr. Karel Jarvis. No show letter not sent as pt has already r/s appt / Sherri S.

## 2014-10-22 ENCOUNTER — Ambulatory Visit (INDEPENDENT_AMBULATORY_CARE_PROVIDER_SITE_OTHER): Payer: 59 | Admitting: Neurology

## 2014-10-22 ENCOUNTER — Encounter: Payer: Self-pay | Admitting: Neurology

## 2014-10-22 VITALS — BP 120/84 | HR 71 | Resp 16 | Ht 76.0 in | Wt 202.0 lb

## 2014-10-22 DIAGNOSIS — G35 Multiple sclerosis: Secondary | ICD-10-CM | POA: Diagnosis not present

## 2014-10-22 NOTE — Progress Notes (Signed)
NEUROLOGY FOLLOW UP OFFICE NOTE  Travis Castillo [id number]  HISTORY OF PRESENT ILLNESS: I had the pleasure of seeing Travis Castillo in follow-up in the neurology clinic on 10/22/2014.  The patient was last seen on 3 months ago for newly diagnosed multiple sclerosis. He is accompanied by his wife today. Our office has been trying to reach Travis Castillo due to denial of coverage for Gilenya. I had wanted to discuss other options with him however he had missed appointments and did not return phone calls. Travis Castillo has been in denial, but now that his wife is on board, they have agreed to start a disease-modifying agent for MS. He denies any symptoms of exacerbation, however has noticed that he is weaker in both hands. He feels it is difficult to hold things tightly. His wife has noticed he does not carry their baby as much. He has noticed easy fatigability. He has also noticed a lot of balance problems where he has to brace himself, no falls. He denies any dizziness, headaches. He still has occasional numbness in both feet. He denies any vision changes, no vision loss. The Lhermitte's sign he presented with initially has not recurred since his initial visit in July 2015.   HPI: This is a very pleasant 34 yo LH man with no significant past medical history who presented for evaluation of possible Lhermitte's sign and numbness last 02/2014. Symptoms started around May 2015 when he had numbness in the fingers of his right hand. After a week, it spread to his entire hand, then a week later, he noticed the numbness in his left hand. The episode lasted 2 weeks. Around June, he noticed that every time he would flex his neck, he feels a sensation of vibration radiating from his neck down his back all the way to his feet. He denies any neck pain, but describes a vibrating sensation in his neck and in his thighs. Symptoms resolve when he lifts his head back to neutral position. He has occasional numbness his toes. He would get numbness  in both legs when bending forward while sitting, which he attributed to positional changes. No facial involvement. He denies similar symptoms in the past. He denies any vision changes/loss of vision in the past. He is very active with basketball, weights, and cardio. He noticed that recently when he does push-ups, his bones feel a little more sore.   His MRI brain with and without contrast had shown extensive periventricular and scattered subcortical white matter changes bilaterally. There is asymmetric white matter disease adjacent to the atrium of the left lateral ventricle. There is extensive involvement of the colossoseptal margin. There is some diffusion signal abnormality within the area involving the right coronal radiata. A 7 x 6 x 2 mm focus of enhancement is associated with a white matter lesion in the anterior left frontal lobe. There are 2 punctate areas of enhancement associated with the area of signal abnormality in the right coronal radiata. MRI C-spine and T-spine normal, no abnormal signal or enhancement seen. He had a lumbar puncture which showed >5 oligoclonal bands. All other studies negative as noted previously. He did have a post-LP headache for several days but did not proceed with blood patch. Vitamin D level 40  PAST MEDICAL HISTORY: No past medical history on file.  MEDICATIONS: No current outpatient prescriptions on file prior to visit.   No current facility-administered medications on file prior to visit.    ALLERGIES: No Known Allergies  FAMILY HISTORY: No family history  on file.  SOCIAL HISTORY: History   Social History  . Marital Status: Married    Spouse Name: N/A  . Number of Children: N/A  . Years of Education: N/A   Occupational History  . Not on file.   Social History Main Topics  . Smoking status: Former Smoker    Types: Cigarettes  . Smokeless tobacco: Not on file  . Alcohol Use: Yes     Comment: social  . Drug Use: No  . Sexual Activity:  Not on file   Other Topics Concern  . Not on file   Social History Narrative    REVIEW OF SYSTEMS: Constitutional: No fevers, chills, or sweats, + generalized fatigue,no change in appetite Eyes: No visual changes, double vision, eye pain Ear, nose and throat: No hearing loss, ear pain, nasal congestion, sore throat Cardiovascular: No chest pain, palpitations Respiratory:  No shortness of breath at rest or with exertion, wheezes GastrointestinaI: No nausea, vomiting, diarrhea, abdominal pain, fecal incontinence Genitourinary:  No dysuria, urinary retention or frequency Musculoskeletal:  No neck pain, back pain Integumentary: No rash, pruritus, skin lesions Neurological: as above Psychiatric: No depression, insomnia, anxiety Endocrine: No palpitations, fatigue, diaphoresis, mood swings, change in appetite, change in weight, increased thirst Hematologic/Lymphatic:  No anemia, purpura, petechiae. Allergic/Immunologic: no itchy/runny eyes, nasal congestion, recent allergic reactions, rashes  PHYSICAL EXAM: Filed Vitals:   10/22/14 1056  BP: 120/84  Pulse: 71  Resp: 16   General: No acute distress Head:  Normocephalic/atraumatic Neck: supple, no paraspinal tenderness, full range of motion Heart:  Regular rate and rhythm Lungs:  Clear to auscultation bilaterally Back: No paraspinal tenderness Skin/Extremities: No rash, no edema Neurological Exam: alert and oriented to person, place, and time. No aphasia or dysarthria. Fund of knowledge is appropriate.  Recent and remote memory are intact.  Attention and concentration are normal.    Able to name objects and repeat phrases. Cranial nerves: Pupils equal, round, reactive to light.  Fundoscopic exam unremarkable, no papilledema. Extraocular movements intact with no nystagmus. Visual fields full. Facial sensation intact. No facial asymmetry. Tongue, uvula, palate midline.  Motor: Bulk and tone normal, muscle strength 5/5 throughout with no  pronator drift.  Sensation to light touch, temperature and vibration intact.  No extinction to double simultaneous stimulation.  Deep tendon reflexes 2+ throughout, toes downgoing.  Finger to nose testing intact.  Gait narrow-based and steady, able to tandem walk adequately.  Romberg negative.  IMPRESSION: This is a very pleasant 34 yo LH man with no significant past medication history, who presented with paresthesias that lasted for 2 weeks that started in May 2015, then had symptoms suggestive of Lhermitte's sign in June. His neurological exam is normal. His MRI brain shows extensive white matter changes bilaterally and in the callososeptal region, with small foci of abnormal enhancement in the left frontal and right corona radiata. CSF showed >5 oligoclonal bands. Findings consistent with a diagnosis of Multiple Sclerosis. We had discussed treatment options on 2 previous visits, however having some difficulties obtaining Gilenya. He would benefit from oral medication, however we also discussed that if we continue to have difficulties with this, he will start Copaxone. Our goal is to get him started on DMD as soon as possible. Side effects of the different medication options were discussed. Start daily vitamin D. If we proceed with Gilenya, he will be referred for an initial ophtho visit and monitor for macular edema. He knows to call our office for any changes in symptoms  and will follow-up a month after starting the medication.  Thank you for allowing me to participate in his care.  Please do not hesitate to call for any questions or concerns.  The duration of this appointment visit was 25 minutes of face-to-face time with the patient.  Greater than 50% of this time was spent in counseling, explanation of diagnosis, planning of further management, and coordination of care.   Patrcia Dolly, M.D.   CC: Dr. Tenny Craw

## 2014-10-22 NOTE — Patient Instructions (Signed)
1. Start medication for MS, let's check with Gilenya, and if still not able, we will plan to start injectable medication 2. Start daily vitamin D with calcium 3. Follow-up in 3 months, call our office for any problems

## 2014-10-27 ENCOUNTER — Encounter: Payer: Self-pay | Admitting: Neurology

## 2014-11-02 ENCOUNTER — Telehealth: Payer: Self-pay | Admitting: *Deleted

## 2014-11-02 NOTE — Telephone Encounter (Signed)
Patient returned my call. Let him know that appt was made for him for tomorrow 3/30 @ 12:45 [facility] w/Dr. Earlene Plater. [address] [phone].

## 2014-11-02 NOTE — Telephone Encounter (Signed)
He states he woke up this morning with blurred vision in left eye, he states that it has improved some since this morning but it's still there. The Gilenya Rx is still being processed. Any adivsement?

## 2014-11-02 NOTE — Telephone Encounter (Signed)
Patient calling to discuss shots instead of medication he is also have some new symptoms he would like to discuss please advise  Call back number [phone]

## 2014-11-02 NOTE — Telephone Encounter (Signed)
Lmovm to return my call. 

## 2014-11-02 NOTE — Telephone Encounter (Signed)
Can we get him to an eye doctor within the next day? Any eye pain or headaches? Thanks

## 2014-11-02 NOTE — Telephone Encounter (Signed)
Pt called/returning your call at 12:47PM. CB  [phone]

## 2014-11-05 ENCOUNTER — Telehealth: Payer: Self-pay | Admitting: *Deleted

## 2014-11-05 NOTE — Telephone Encounter (Signed)
-----   Message from Van Clines, MD sent at 11/05/2014 11:11 AM EDT ----- Regarding: pls f/u Can you pls call over to [facility] where patient went this week and request for copy of visit note? If unable to obtain, pls call pt and see how he is doing. If he does not answer, pls call his wife who has been on top of things.  Thanks!

## 2014-11-05 NOTE — Telephone Encounter (Signed)
Called Travis Castillo to request visit notes. LMOM with fax number will call again. Also spoke with patient he state he had eye exam (20/20) vision a little better than the other day. Patient is waiting to her back about injections.

## 2014-11-08 NOTE — Telephone Encounter (Signed)
Can you pls f/u? If no luck with Gilenya, let's go ahead with Copaxone. Thanks

## 2014-11-09 NOTE — Telephone Encounter (Signed)
Lmovm to return my call. I need to speak with him about Copaxone Rx.

## 2014-11-10 NOTE — Telephone Encounter (Signed)
I called and spoke with patient's wife Lowella Bandy. Explained to her that Dr. Karel Jarvis wants to go ahead and get him started on Capaxone 40 mg, but before we can submit prescription that we need his signature on the Rx form. She states that she would get in touch with right away and try to have him come by this afternoon to sign the form.

## 2014-11-24 ENCOUNTER — Telehealth: Payer: Self-pay | Admitting: Neurology

## 2014-11-24 NOTE — Telephone Encounter (Signed)
I returned the call to the pharmacy. Did notify that patient hasn't been on Copaxone 20 mg or it's generic & patient will not be taking another disease modifying agent. They will proceed with PA for patient.

## 2014-11-24 NOTE — Telephone Encounter (Signed)
[facility] called. They are working on PA for this patient's Copaxone.  They need to know if the patient is taking this with another disease modifying agent and if the patient has previously tried and failed Copaxone 20mg  or the generic. Please call [phone] / Oneita Kras

## 2014-12-07 ENCOUNTER — Telehealth: Payer: Self-pay | Admitting: Family Medicine

## 2014-12-07 NOTE — Telephone Encounter (Signed)
Called patient lmovm to return my call. Also called patient's wife/Nikki on her cell phone to return my call.

## 2014-12-07 NOTE — Telephone Encounter (Signed)
Patient returned my call. I had called him because we received a fax yesterday from [facility] the company that is processing his Copaxone Rx. They have tried contacting him several times and haven't received a return call. I explained this to him and that it was important that he calls them as soon as he can. When I talked to the rep at [facility] they said everything had been finalized and med was ready to ship out to patient from [facility] they just needed to speak with the patient before med was shipped to him. I did give patient number to [facility] [phone]. He said that he would give them a call. I told him to give [place] a call if he at anytime he has questions, concerns, or doesn't understand something as he is nervous about medication because it's an injectable.

## 2014-12-29 ENCOUNTER — Emergency Department (HOSPITAL_COMMUNITY): Payer: 59

## 2014-12-29 ENCOUNTER — Encounter (HOSPITAL_COMMUNITY): Payer: Self-pay | Admitting: *Deleted

## 2014-12-29 ENCOUNTER — Emergency Department (HOSPITAL_COMMUNITY)
Admission: EM | Admit: 2014-12-29 | Discharge: 2014-12-29 | Disposition: A | Discharge status: Home / Self Care | Payer: 59 | Attending: Emergency Medicine | Admitting: Emergency Medicine

## 2014-12-29 DIAGNOSIS — Z87891 Personal history of nicotine dependence: Secondary | ICD-10-CM | POA: Diagnosis not present

## 2014-12-29 DIAGNOSIS — M542 Cervicalgia: Secondary | ICD-10-CM

## 2014-12-29 DIAGNOSIS — Z202 Contact with and (suspected) exposure to infections with a predominantly sexual mode of transmission: Secondary | ICD-10-CM

## 2014-12-29 DIAGNOSIS — S0993XA Unspecified injury of face, initial encounter: Secondary | ICD-10-CM | POA: Insufficient documentation

## 2014-12-29 DIAGNOSIS — Z79899 Other long term (current) drug therapy: Secondary | ICD-10-CM | POA: Diagnosis not present

## 2014-12-29 DIAGNOSIS — S199XXA Unspecified injury of neck, initial encounter: Secondary | ICD-10-CM | POA: Diagnosis not present

## 2014-12-29 DIAGNOSIS — S0990XA Unspecified injury of head, initial encounter: Secondary | ICD-10-CM | POA: Insufficient documentation

## 2014-12-29 DIAGNOSIS — Z23 Encounter for immunization: Secondary | ICD-10-CM | POA: Diagnosis not present

## 2014-12-29 DIAGNOSIS — R0781 Pleurodynia: Secondary | ICD-10-CM

## 2014-12-29 DIAGNOSIS — Y998 Other external cause status: Secondary | ICD-10-CM | POA: Insufficient documentation

## 2014-12-29 DIAGNOSIS — R519 Headache, unspecified: Secondary | ICD-10-CM

## 2014-12-29 DIAGNOSIS — Y9289 Other specified places as the place of occurrence of the external cause: Secondary | ICD-10-CM | POA: Insufficient documentation

## 2014-12-29 DIAGNOSIS — S29001A Unspecified injury of muscle and tendon of front wall of thorax, initial encounter: Secondary | ICD-10-CM | POA: Diagnosis not present

## 2014-12-29 DIAGNOSIS — Y9389 Activity, other specified: Secondary | ICD-10-CM | POA: Insufficient documentation

## 2014-12-29 DIAGNOSIS — Z8669 Personal history of other diseases of the nervous system and sense organs: Secondary | ICD-10-CM | POA: Insufficient documentation

## 2014-12-29 DIAGNOSIS — R51 Headache: Secondary | ICD-10-CM

## 2014-12-29 DIAGNOSIS — R0789 Other chest pain: Secondary | ICD-10-CM

## 2014-12-29 HISTORY — DX: Multiple sclerosis: G35

## 2014-12-29 LAB — URINALYSIS, ROUTINE W REFLEX MICROSCOPIC
Bilirubin Urine: NEGATIVE
GLUCOSE, UA: NEGATIVE mg/dL
Ketones, ur: NEGATIVE mg/dL
LEUKOCYTES UA: NEGATIVE
Nitrite: NEGATIVE
Protein, ur: NEGATIVE mg/dL
SPECIFIC GRAVITY, URINE: 1.011 (ref 1.005–1.030)
Urobilinogen, UA: 0.2 mg/dL (ref 0.0–1.0)
pH: 6 (ref 5.0–8.0)

## 2014-12-29 LAB — URINE MICROSCOPIC-ADD ON

## 2014-12-29 MED ORDER — CEFTRIAXONE SODIUM 250 MG IJ SOLR
250.0000 mg | Freq: Once | INTRAMUSCULAR | Status: AC
  Administered 2014-12-29: 250 mg via INTRAMUSCULAR
  Filled 2014-12-29: qty 250

## 2014-12-29 MED ORDER — HYDROCODONE-ACETAMINOPHEN 5-325 MG PO TABS
1.0000 | ORAL_TABLET | Freq: Once | ORAL | Status: AC
  Administered 2014-12-29: 1 via ORAL
  Filled 2014-12-29: qty 1

## 2014-12-29 MED ORDER — TETANUS-DIPHTH-ACELL PERTUSSIS 5-2.5-18.5 LF-MCG/0.5 IM SUSP
0.5000 mL | Freq: Once | INTRAMUSCULAR | Status: AC
  Administered 2014-12-29: 0.5 mL via INTRAMUSCULAR
  Filled 2014-12-29: qty 0.5

## 2014-12-29 MED ORDER — LIDOCAINE HCL (PF) 1 % IJ SOLN
INTRAMUSCULAR | Status: AC
  Administered 2014-12-29: 0.9 mL
  Filled 2014-12-29: qty 5

## 2014-12-29 MED ORDER — AZITHROMYCIN 250 MG PO TABS
1000.0000 mg | ORAL_TABLET | Freq: Every day | ORAL | Status: DC
  Administered 2014-12-29: 1000 mg via ORAL
  Filled 2014-12-29: qty 4
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
## 2014-12-30 ENCOUNTER — Telehealth: Payer: Self-pay | Admitting: *Deleted

## 2014-12-30 LAB — GC/CHLAMYDIA PROBE AMP (~~LOC~~) NOT AT ARMC
Chlamydia: NEGATIVE
NEISSERIA GONORRHEA: NEGATIVE

## 2014-12-30 LAB — HIV ANTIBODY (ROUTINE TESTING W REFLEX): HIV SCREEN 4TH GENERATION: NONREACTIVE

## 2014-12-30 LAB — RPR: RPR: NONREACTIVE

## 2014-12-30 NOTE — Telephone Encounter (Signed)
Patients wife called in reference to his medication  Call back [place] [phone]

## 2014-12-30 NOTE — Telephone Encounter (Signed)
Called patient's wife back and she was calling about the Copaxone but patient was actually on the phone at that time ordering the Copaxone.

## 2015-01-04 ENCOUNTER — Telehealth: Payer: Self-pay | Admitting: Family Medicine

## 2015-01-04 NOTE — Telephone Encounter (Signed)
Received fax from [employer] that patient will receive/start Copaxone 40 mg on or around 01/03/2015.

## 2015-01-17 ENCOUNTER — Encounter: Payer: Self-pay | Admitting: Neurology

## 2015-01-17 ENCOUNTER — Ambulatory Visit (INDEPENDENT_AMBULATORY_CARE_PROVIDER_SITE_OTHER): Payer: 59 | Admitting: Neurology

## 2015-01-17 VITALS — BP 140/68 | HR 100 | Ht 77.0 in | Wt 196.0 lb

## 2015-01-17 DIAGNOSIS — F411 Generalized anxiety disorder: Secondary | ICD-10-CM | POA: Diagnosis not present

## 2015-01-17 DIAGNOSIS — G35 Multiple sclerosis: Secondary | ICD-10-CM | POA: Diagnosis not present

## 2015-01-17 NOTE — Patient Instructions (Addendum)
1. Continue Copaxone, keep an alarm to remind you to take medication 2. Refer to Behavioral Medicine for psychotherapy for anxiety. We have placed a referral. You will need to call [phone] to set up an appt.  3. Continue to monitor your mood and symptoms, call for any changes 4. Follow-up in 3 months

## 2015-01-17 NOTE — Progress Notes (Signed)
NEUROLOGY FOLLOW UP OFFICE NOTE  Travis Castillo [id number]  HISTORY OF PRESENT ILLNESS: I had the pleasure of seeing Travis Castillo in follow-up in the neurology clinic on 01/17/2015.  The patient was last seen 3 months ago for newly diagnosed multiple sclerosis. He is again accompanied by his wife who helps supplement the history today. Since his last visit, we had been having difficulties getting coverage for oral DMDs. He agreed to start injectable medication, and finally got his Copaxone prescription and teaching 2 weeks ago. He tells me that he is still getting used to the shots, but had compliance issues this week because of his relationship problems with his wife. She reports that he has mood swings, he would be very happy one time, then become verbally short with her. He started seeing a therapist at their workplace and has been told he has anxiety and depression. He denies feeling depressed, but does agree he had a little depression a while back. He does feel anxious at times, and reports an episode 8 months ago while holding he daughter in the doctor's office, he felt lightheaded like he would pass out. Two months ago, he felt wobbly and was suddenly dripping with sweat. These last briefly, and he attributes them to anxiety.  He denies any change in neurological symptoms. He continues to have difficulty standing for prolonged periods, he easily gets tired and has to break their meeting huddles. He has noticed memory changes, he took out money one time and did not know why he took it out. His wife has noticed memory problems for the past 2-3 years, "his attention span is not there." He cannot recall something they had talked about previously. Around 2 months ago, he had blurred vision in the left eye for 4 days, ophtho exam reported as normal. He has noticed balance issues, he has stumbled playing basketball and overall feels his balance is off. He denies any focal numbness/tingling/weakness, loss of  vision, incoordination, no bowel/bladder dysfunction.   HPI: This is a very pleasant 34 yo LH man with no significant past medical history who presented for evaluation of possible Lhermitte's sign and numbness last 02/2014. Symptoms started around May 2015 when he had numbness in the fingers of his right hand. After a week, it spread to his entire hand, then a week later, he noticed the numbness in his left hand. The episode lasted 2 weeks. Around June, he noticed that every time he would flex his neck, he feels a sensation of vibration radiating from his neck down his back all the way to his feet. He denies any neck pain, but describes a vibrating sensation in his neck and in his thighs. Symptoms resolve when he lifts his head back to neutral position. He has occasional numbness his toes. He would get numbness in both legs when bending forward while sitting, which he attributed to positional changes. No facial involvement. He denies similar symptoms in the past. He denies any vision changes/loss of vision in the past. He is very active with basketball, weights, and cardio. He noticed that recently when he does push-ups, his bones feel a little more sore.   His MRI brain with and without contrast had shown extensive periventricular and scattered subcortical white matter changes bilaterally. There is asymmetric white matter disease adjacent to the atrium of the left lateral ventricle. There is extensive involvement of the colossoseptal margin. There is some diffusion signal abnormality within the area involving the right coronal radiata. A 7 x  6 x 2 mm focus of enhancement is associated with a white matter lesion in the anterior left frontal lobe. There are 2 punctate areas of enhancement associated with the area of signal abnormality in the right coronal radiata. MRI C-spine and T-spine normal, no abnormal signal or enhancement seen. He had a lumbar puncture which showed >5 oligoclonal bands. All other studies  negative as noted previously. He did have a post-LP headache for several days but did not proceed with blood patch. Vitamin D level 40  PAST MEDICAL HISTORY: Past Medical History  Diagnosis Date  . MS (multiple sclerosis)     MEDICATIONS: Current Outpatient Prescriptions on File Prior to Visit  Medication Sig Dispense Refill  . Multiple Vitamin (MULTI-VITAMIN PO) Take 1 tablet by mouth daily.     No current facility-administered medications on file prior to visit.    ALLERGIES: Allergies  Allergen Reactions  . Mucinex [Guaifenesin Er] Other (See Comments)    Projectile vomiting    FAMILY HISTORY: No family history on file.  SOCIAL HISTORY: History   Social History  . Marital Status: Married    Spouse Name: N/A  . Number of Children: N/A  . Years of Education: N/A   Occupational History  . Not on file.   Social History Main Topics  . Smoking status: Former Smoker    Types: Cigarettes  . Smokeless tobacco: Not on file  . Alcohol Use: Yes     Comment: social  . Drug Use: No  . Sexual Activity: Not on file   Other Topics Concern  . Not on file   Social History Narrative    REVIEW OF SYSTEMS: Constitutional: No fevers, chills, or sweats, + generalized fatigue,no change in appetite Eyes: No visual changes, double vision, eye pain Ear, nose and throat: No hearing loss, ear pain, nasal congestion, sore throat Cardiovascular: No chest pain, palpitations Respiratory:  No shortness of breath at rest or with exertion, wheezes GastrointestinaI: No nausea, vomiting, diarrhea, abdominal pain, fecal incontinence Genitourinary:  No dysuria, urinary retention or frequency Musculoskeletal:  No neck pain, back pain Integumentary: No rash, pruritus, skin lesions Neurological: as above Psychiatric: + depression, insomnia, anxiety Endocrine: No palpitations, fatigue, diaphoresis, mood swings, change in appetite, change in weight, increased thirst Hematologic/Lymphatic:   No anemia, purpura, petechiae. Allergic/Immunologic: no itchy/runny eyes, nasal congestion, recent allergic reactions, rashes  PHYSICAL EXAM: Filed Vitals:   01/17/15 1059  BP: 140/68  Pulse: 100   General: No acute distress Head:  Normocephalic/atraumatic Neck: supple, no paraspinal tenderness, full range of motion Heart:  Regular rate and rhythm Lungs:  Clear to auscultation bilaterally Back: No paraspinal tenderness Skin/Extremities: No rash, no edema Neurological Exam: alert and oriented to person, place, and time. No aphasia or dysarthria. Fund of knowledge is appropriate.  Recent and remote memory are intact.  Attention and concentration are normal.    Able to name objects and repeat phrases. Cranial nerves: Pupils equal, round, reactive to light.  Fundoscopic exam unremarkable, no papilledema. VA 20/20 OU, no red color desaturation. Extraocular movements intact with no nystagmus. Visual fields full. Facial sensation intact. No facial asymmetry. Tongue, uvula, palate midline.  Motor: Bulk and tone normal, muscle strength 5/5 throughout with no pronator drift.  Sensation to light touch, temperature  intact.  No extinction to double simultaneous stimulation.  Deep tendon reflexes 2+ throughout, toes downgoing.  Finger to nose testing intact.  Gait narrow-based and steady, able to tandem walk adequately.  Romberg negative.  IMPRESSION: This  is a very pleasant 34 yo LH man with no significant past medication history, who initially presented with paresthesias that lasted for 2 weeks that started in May 2015, then had symptoms suggestive of Lhermitte's sign in June 2015. His neurological exam is normal. His MRI brain shows extensive white matter changes bilaterally and in the callososeptal region, with small foci of abnormal enhancement in the left frontal and right corona radiata. CSF showed >5 oligoclonal bands. Findings consistent with a diagnosis of Multiple Sclerosis. He only recently  started Copaxone (2 weeks ago), and will continue on this. He denies any new neurological symptoms, but continues to deal with balance issues. He is also having mood changes now with anxiety and some depression. He would like to do psychotherapy and avoid medication if possible. He is also having fatigue and memory changes, continue to monitor. He will follow-up in 3 months and knows to call our office for any changes.   Thank you for allowing me to participate in his care.  Please do not hesitate to call for any questions or concerns.  The duration of this appointment visit was 15 minutes of face-to-face time with the patient.  Greater than 50% of this time was spent in counseling, explanation of diagnosis, planning of further management, and coordination of care.   Patrcia Dolly, M.D.   CC: Dr. Tenny Craw

## 2015-04-21 ENCOUNTER — Ambulatory Visit: Payer: 59 | Admitting: Neurology

## 2015-04-21 ENCOUNTER — Encounter: Payer: Self-pay | Admitting: *Deleted

## 2015-06-10 ENCOUNTER — Encounter: Payer: Self-pay | Admitting: Neurology

## 2015-06-10 ENCOUNTER — Ambulatory Visit (INDEPENDENT_AMBULATORY_CARE_PROVIDER_SITE_OTHER): Payer: 59 | Admitting: Neurology

## 2015-06-10 VITALS — BP 110/70 | HR 52 | Wt 194.4 lb

## 2015-06-10 DIAGNOSIS — G35 Multiple sclerosis: Secondary | ICD-10-CM

## 2015-06-10 NOTE — Patient Instructions (Signed)
1. Schedule MRI brain with and without contrast 2. It is very important to take your Copaxone regularly. Put an alarm on your phone as a reminder 3. Follow-up in 3 months, call for any changes

## 2015-06-10 NOTE — Progress Notes (Signed)
NEUROLOGY FOLLOW UP OFFICE NOTE  Travis Castillo [id number]  HISTORY OF PRESENT ILLNESS: I had the pleasure of seeing Travis Castillo in follow-up in the neurology clinic on 06/10/2015.  The patient was last seen 5 months ago for newly diagnosed multiple sclerosis. He is again accompanied by his wife who helps supplement the history today. He is taking Copaxone, but admits to poor compliance, taking it 1-2 times a week instead of 3 times a week. He states he forgets. One time he had a reaction where his arm was twitching after administering a shot. He has had intermittent numbness in both arms and legs, but since yesterday has been having more constant left arm numbness. He is having more fatigue, with difficulty standing at work for their 15 minute meetings. He is looking to be moved to a different position because he feels his co-workers think he is lazy. Around 3 months ago, he had dizziness and went to the ground, leaving all his things on the floor. He is unsure if he lost consciousness but does not think so. He reports his mood is "not the greatest," no suicidal ideation. He denies any headaches, vision changes, focal weakness, bowel/bladder dysfunction.   HPI: This is a very pleasant 34 yo LH man with no significant past medical history who presented for evaluation of possible Lhermitte's sign and numbness last 02/2014. Symptoms started around May 2015 when he had numbness in the fingers of his right hand. After a week, it spread to his entire hand, then a week later, he noticed the numbness in his left hand. The episode lasted 2 weeks. Around June 2015, he noticed that every time he would flex his neck, he feels a sensation of vibration radiating from his neck down his back all the way to his feet. He denies any neck pain, but describes a vibrating sensation in his neck and in his thighs Symptoms resolve when he lifts his head back to neutral position. He has occasional numbness his toes. He would get  numbness in both legs when bending forward while sitting, which he attributed to positional changes. No facial involvement. He denies similar symptoms in the past. He denies any vision changes/loss of vision in the past. He is very active with basketball, weights, and cardio. He noticed that recently when he does push-ups, his bones feel a little more sore.   His MRI brain with and without contrast had shown extensive periventricular and scattered subcortical white matter changes bilaterally. There is asymmetric white matter disease adjacent to the atrium of the left lateral ventricle. There is extensive involvement of the colossoseptal margin. There is some diffusion signal abnormality within the area involving the right coronal radiata. A 7 x 6 x 2 mm focus of enhancement is associated with a white matter lesion in the anterior left frontal lobe. There are 2 punctate areas of enhancement associated with the area of signal abnormality in the right coronal radiata. MRI C-spine and T-spine normal, no abnormal signal or enhancement seen. He had a lumbar puncture which showed >5 oligoclonal bands. All other studies negative as noted previously. He did have a post-LP headache for several days but did not proceed with blood patch. Vitamin D level 40  PAST MEDICAL HISTORY: Past Medical History  Diagnosis Date  . MS (multiple sclerosis) (HCC)     MEDICATIONS: Current Outpatient Prescriptions on File Prior to Visit  Medication Sig Dispense Refill  . COPAXONE 40 MG/ML SOSY     . Multiple Vitamin (  MULTI-VITAMIN PO) Take 1 tablet by mouth daily.     No current facility-administered medications on file prior to visit.    ALLERGIES: Allergies  Allergen Reactions  . Mucinex [Guaifenesin Er] Other (See Comments)    Projectile vomiting    FAMILY HISTORY: Family History  Problem Relation Age of Onset  . Hypertension Father   . Diabetes Paternal Aunt     SOCIAL HISTORY: Social History   Social  History  . Marital Status: Married    Spouse Name: N/A  . Number of Children: N/A  . Years of Education: N/A   Occupational History  . Not on file.   Social History Main Topics  . Smoking status: Current Every Day Smoker    Types: Cigarettes  . Smokeless tobacco: Never Used  . Alcohol Use: 0.0 oz/week    0 Standard drinks or equivalent per week     Comment: social  . Drug Use: No  . Sexual Activity: Not on file   Other Topics Concern  . Not on file   Social History Narrative   Lives with wife and children in a two story home.  Works for [employer].    REVIEW OF SYSTEMS: Constitutional: No fevers, chills, or sweats, + generalized fatigue,no change in appetite Eyes: No visual changes, double vision, eye pain Ear, nose and throat: No hearing loss, ear pain, nasal congestion, sore throat Cardiovascular: No chest pain, palpitations Respiratory:  No shortness of breath at rest or with exertion, wheezes GastrointestinaI: No nausea, vomiting, diarrhea, abdominal pain, fecal incontinence Genitourinary:  No dysuria, urinary retention or frequency Musculoskeletal:  No neck pain, back pain Integumentary: No rash, pruritus, skin lesions Neurological: as above Psychiatric: + depression, insomnia, anxiety Endocrine: No palpitations, fatigue, diaphoresis, mood swings, change in appetite, change in weight, increased thirst Hematologic/Lymphatic:  No anemia, purpura, petechiae. Allergic/Immunologic: no itchy/runny eyes, nasal congestion, recent allergic reactions, rashes  PHYSICAL EXAM: Filed Vitals:   06/10/15 1502  BP: 110/70  Pulse: 52   General: No acute distress Head:  Normocephalic/atraumatic Neck: supple, no paraspinal tenderness, full range of motion Heart:  Regular rate and rhythm Lungs:  Clear to auscultation bilaterally Back: No paraspinal tenderness Skin/Extremities: No rash, no edema Neurological Exam: alert and oriented to person, place, and time. No aphasia or  dysarthria. Fund of knowledge is appropriate.  Recent and remote memory are intact.  Attention and concentration are normal.    Able to name objects and repeat phrases. Cranial nerves: Pupils equal, round, reactive to light.  Fundoscopic exam unremarkable, no papilledema. Extraocular movements intact with no nystagmus. Visual fields full. Facial sensation intact. No facial asymmetry. Tongue, uvula, palate midline.  Motor: Bulk and tone normal, muscle strength 5/5 throughout with no pronator drift.  Sensation to light touch, temperature  intact.  No extinction to double simultaneous stimulation.  Deep tendon reflexes 2+ throughout, toes downgoing.  Finger to nose testing intact.  Gait narrow-based and steady, able to tandem walk adequately.  Romberg negative. Negative Lhermitte's sign.  IMPRESSION: This is a very pleasant 34 yo LH man with no significant past medication history, who initially presented with paresthesias that lasted for 2 weeks that started in May 2015, then had symptoms suggestive of Lhermitte's sign in June 2015. His MRI brain in July 2015 showed extensive white matter changes bilaterally and in the callososeptal region, with small foci of abnormal enhancement in the left frontal and right corona radiata. CSF showed >5 oligoclonal bands. Findings consistent with a diagnosis of Multiple  Sclerosis. He was lost to follow-up and only started Copaxone almost a year later, last June 2016. He has some compliance issues, we again discussed the importance of compliance. He reports worsening numbness, more on his left arm since yesterday. A repeat MRI brain with and without contrast will be ordered to assess MS activity. He reports more fatigue, as well as depression and anxiety. He is not interested in medication for depression at this time, and will continue to monitor mood. He will follow-up in 3 months and knows to call our office for any changes.   Thank you for allowing me to participate in his  care.  Please do not hesitate to call for any questions or concerns.  The duration of this appointment visit was 25 minutes of face-to-face time with the patient.  Greater than 50% of this time was spent in counseling, explanation of diagnosis, planning of further management, and coordination of care.   Patrcia Dolly, M.D.   CC: Dr. Tenny Craw

## 2015-06-12 ENCOUNTER — Encounter: Payer: Self-pay | Admitting: Neurology

## 2015-06-27 ENCOUNTER — Inpatient Hospital Stay: Admission: RE | Admit: 2015-06-27 | Payer: 59 | Source: Ambulatory Visit

## 2015-07-13 ENCOUNTER — Ambulatory Visit
Admission: RE | Admit: 2015-07-13 | Discharge: 2015-07-13 | Disposition: A | Discharge status: Home / Self Care | Payer: 59 | Source: Ambulatory Visit | Attending: Neurology | Admitting: Neurology

## 2015-07-13 DIAGNOSIS — G35 Multiple sclerosis: Secondary | ICD-10-CM

## 2015-07-13 MED ORDER — GADOBENATE DIMEGLUMINE 529 MG/ML IV SOLN
20.0000 mL | Freq: Once | INTRAVENOUS | Status: AC | PRN
  Administered 2015-07-13: 20 mL via INTRAVENOUS

## 2015-07-15 ENCOUNTER — Telehealth: Payer: Self-pay | Admitting: Family Medicine

## 2015-07-15 NOTE — Telephone Encounter (Signed)
Patient was notified of results & advisement. 

## 2015-07-15 NOTE — Telephone Encounter (Signed)
-----   Message from Van Clines, MD sent at 07/14/2015  2:18 PM EST ----- Pls let Travis Castillo know that the MRI does not show any new lesions, stable from last study. It is very important that he take medication on a regular basis and to let us know if he is having difficulty doing this. Thanks

## 2015-09-27 ENCOUNTER — Ambulatory Visit (INDEPENDENT_AMBULATORY_CARE_PROVIDER_SITE_OTHER): Payer: 59 | Admitting: Neurology

## 2015-09-27 ENCOUNTER — Encounter: Payer: Self-pay | Admitting: Neurology

## 2015-09-27 VITALS — BP 122/88 | HR 96 | Ht 77.0 in | Wt 196.0 lb

## 2015-09-27 DIAGNOSIS — R5383 Other fatigue: Secondary | ICD-10-CM | POA: Diagnosis not present

## 2015-09-27 DIAGNOSIS — G35 Multiple sclerosis: Secondary | ICD-10-CM | POA: Diagnosis not present

## 2015-09-27 MED ORDER — MODAFINIL 100 MG PO TABS: ORAL_TABLET | ORAL | Status: DC

## 2015-09-27 NOTE — Progress Notes (Addendum)
NEUROLOGY FOLLOW UP OFFICE NOTE  Arham Symmonds [id number]  HISTORY OF PRESENT ILLNESS: I had the pleasure of seeing Mical Kicklighter in follow-up in the neurology clinic on 09/27/2015.  The patient was last seen 4 months ago for multiple sclerosis. He is again accompanied by his wife who helps supplement the history today. He continues to report poor compliance to Copaxone, reporting the last 2 times he used it, his muscle was jumping and blood kept coming out. This was 1-1/2 months ago. He has not taken Copaxone since, and tells me he has been planning to call [facility] to but has not gotten to it. On his last visit, he reported more constant left arm numbness, fatigue, difficulty standing for more than 15 minutes. I personally reviewed repeat MRI brain with and without contrast which was overall stable, showing extensive white matter changes bilaterally, 7mm lesion in the left midbrain, asymmetric left-sided T2 changes in the cerebellum. There is extensive involvement of the colossal septal margin. The 3mm focal area of enhancement in the left frontal operculum is stable. No new lesions.   Since his last visit, he denies any new symptoms. His fatigue is "terrible." He gets 7 hours of interrupted sleep. He had blurred vision for a few hours one time which resolved that day. He has left-sided neck pain. He reports being really tired, he works second shift. His wife has noticed muscle twitching at night, mostly in the arms, but also in his legs when he is sleeping. He denies any falls. He reports mood is okay, he and his wife are trying to vacation more. He denies any headaches, focal weakness, bowel/bladder dysfunction.   HPI: This is a very pleasant 35 yo LH man with no significant past medical history who presented for evaluation of possible Lhermitte's sign and numbness last 02/2014. Symptoms started around May 2015 when he had numbness in the fingers of his right hand. After a week, it spread to  his entire hand, then a week later, he noticed the numbness in his left hand. The episode lasted 2 weeks. Around June 2015, he noticed that every time he would flex his neck, he feels a sensation of vibration radiating from his neck down his back all the way to his feet. He denies any neck pain, but describes a vibrating sensation in his neck and in his thighs Symptoms resolve when he lifts his head back to neutral position. He has occasional numbness his toes. He would get numbness in both legs when bending forward while sitting, which he attributed to positional changes. No facial involvement. He denies similar symptoms in the past. He denies any vision changes/loss of vision in the past. He is very active with basketball, weights, and cardio. He noticed that recently when he does push-ups, his bones feel a little more sore.   His MRI brain with and without contrast had shown extensive periventricular and scattered subcortical white matter changes bilaterally. There is asymmetric white matter disease adjacent to the atrium of the left lateral ventricle. There is extensive involvement of the colossoseptal margin. There is some diffusion signal abnormality within the area involving the right coronal radiata. A 7 x 6 x 2 mm focus of enhancement is associated with a white matter lesion in the anterior left frontal lobe. There are 2 punctate areas of enhancement associated with the area of signal abnormality in the right coronal radiata. MRI C-spine and T-spine normal, no abnormal signal or enhancement seen. He had a lumbar puncture  which showed >5 oligoclonal bands. All other studies negative as noted previously. He did have a post-LP headache for several days but did not proceed with blood patch. Vitamin D level 40  PAST MEDICAL HISTORY: Past Medical History  Diagnosis Date  . MS (multiple sclerosis) (HCC)     MEDICATIONS: Current Outpatient Prescriptions on File Prior to Visit  Medication Sig Dispense  Refill  . COPAXONE 40 MG/ML SOSY Every other daily     No current facility-administered medications on file prior to visit.    ALLERGIES: Allergies  Allergen Reactions  . Mucinex [Guaifenesin Er] Other (See Comments)    Projectile vomiting    FAMILY HISTORY: Family History  Problem Relation Age of Onset  . Hypertension Father   . Diabetes Paternal Aunt     SOCIAL HISTORY: Social History   Social History  . Marital Status: Married    Spouse Name: N/A  . Number of Children: N/A  . Years of Education: N/A   Occupational History  . Not on file.   Social History Main Topics  . Smoking status: Current Every Day Smoker    Types: Cigarettes  . Smokeless tobacco: Never Used  . Alcohol Use: 0.0 oz/week    0 Standard drinks or equivalent per week     Comment: social  . Drug Use: No  . Sexual Activity: Not on file   Other Topics Concern  . Not on file   Social History Narrative   Lives with wife and children in a two story home.  Works for [employer].    REVIEW OF SYSTEMS: Constitutional: No fevers, chills, or sweats, + generalized fatigue,no change in appetite Eyes: No visual changes, double vision, eye pain Ear, nose and throat: No hearing loss, ear pain, nasal congestion, sore throat Cardiovascular: No chest pain, palpitations Respiratory:  No shortness of breath at rest or with exertion, wheezes GastrointestinaI: No nausea, vomiting, diarrhea, abdominal pain, fecal incontinence Genitourinary:  No dysuria, urinary retention or frequency Musculoskeletal:  No neck pain, back pain Integumentary: No rash, pruritus, skin lesions Neurological: as above Psychiatric: + depression, insomnia, anxiety Endocrine: No palpitations, fatigue, diaphoresis, mood swings, change in appetite, change in weight, increased thirst Hematologic/Lymphatic:  No anemia, purpura, petechiae. Allergic/Immunologic: no itchy/runny eyes, nasal congestion, recent allergic reactions,  rashes  PHYSICAL EXAM: Filed Vitals:   09/27/15 1614  BP: 122/88  Pulse: 96   General: No acute distress Head:  Normocephalic/atraumatic Neck: supple, no paraspinal tenderness, full range of motion Heart:  Regular rate and rhythm Lungs:  Clear to auscultation bilaterally Back: No paraspinal tenderness Skin/Extremities: No rash, no edema Neurological Exam: alert and oriented to person, place, and time. No aphasia or dysarthria. Fund of knowledge is appropriate.  Recent and remote memory are intact.  Attention and concentration are normal.    Able to name objects and repeat phrases. Cranial nerves: Pupils equal, round, reactive to light.  Fundoscopic exam unremarkable, no papilledema. Extraocular movements intact with no nystagmus. Visual fields full. Facial sensation intact. No facial asymmetry. Tongue, uvula, palate midline.  Motor: Bulk and tone normal, muscle strength 5/5 throughout with no pronator drift.  Sensation to light touch, temperature  intact.  No extinction to double simultaneous stimulation.  Deep tendon reflexes 2+ throughout, toes downgoing.  Finger to nose testing intact.  Gait narrow-based and steady, able to tandem walk adequately.  Romberg negative. Negative Lhermitte's sign.  IMPRESSION: This is a very pleasant 35 yo LH man with no significant past medication history, who  initially presented with paresthesias that lasted for 2 weeks that started in May 2015, then had symptoms suggestive of Lhermitte's sign in June 2015. His MRI brain in July 2015 showed extensive white matter changes bilaterally and in the callososeptal region, with small foci of abnormal enhancement in the left frontal and right corona radiata. CSF showed >5 oligoclonal bands. Findings consistent with a diagnosis of Multiple Sclerosis. Repeat MRI brain 07/2015 stable. He is having compliance issues with Copaxone, reporting muscle twitching and bleeding from injection site, and had stopped it 1-1/2 months ago.  He was instructed to call [facility] for advice on proper administration. If he continues to have issues, we will plan to switch over to oral disease modifying agents. His main concern today is fatigue, he will start Modafinil 100mg  1 hour before workshift for MS-associated fatigue. Side effects were discussed. He will follow-up in 2 months.   Thank you for allowing me to participate in his care.  Please do not hesitate to call for any questions or concerns.  The duration of this appointment visit was 25 minutes of face-to-face time with the patient.  Greater than 50% of this time was spent in counseling, explanation of diagnosis, planning of further management, and coordination of care.   Patrcia Dolly, M.D.   CC: Dr. Tenny Craw

## 2015-09-27 NOTE — Patient Instructions (Signed)
1. Call [facility] to get back on Copaxone. IT IS VERY IMPORTANT that you call them asap. 2. Start Modafinil 100mg : Take 1 tablet 1 hour before work shift 3. Follow-up in 2 months

## 2015-10-05 ENCOUNTER — Encounter: Payer: Self-pay | Admitting: Neurology

## 2015-10-05 DIAGNOSIS — R5383 Other fatigue: Secondary | ICD-10-CM | POA: Insufficient documentation

## 2015-11-16 ENCOUNTER — Ambulatory Visit: Payer: 59 | Admitting: Neurology

## 2015-11-16 DIAGNOSIS — Z029 Encounter for administrative examinations, unspecified: Secondary | ICD-10-CM

## 2015-11-21 ENCOUNTER — Encounter: Payer: Self-pay | Admitting: Neurology

## 2016-06-06 ENCOUNTER — Ambulatory Visit (INDEPENDENT_AMBULATORY_CARE_PROVIDER_SITE_OTHER): Payer: 59 | Admitting: Neurology

## 2016-06-06 ENCOUNTER — Encounter: Payer: Self-pay | Admitting: Neurology

## 2016-06-06 VITALS — BP 128/82 | HR 92 | Ht 77.0 in | Wt 205.0 lb

## 2016-06-06 DIAGNOSIS — G35 Multiple sclerosis: Secondary | ICD-10-CM

## 2016-06-06 DIAGNOSIS — M542 Cervicalgia: Secondary | ICD-10-CM

## 2016-06-06 MED ORDER — CYCLOBENZAPRINE HCL 5 MG PO TABS: 5.0000 mg | ORAL_TABLET | Freq: Three times a day (TID) | ORAL | 3 refills | Status: DC | PRN

## 2016-06-06 MED ORDER — PREDNISONE 20 MG PO TABS: ORAL_TABLET | ORAL | 0 refills | Status: DC

## 2016-06-06 NOTE — Progress Notes (Signed)
NEUROLOGY FOLLOW UP OFFICE NOTE  Travis Castillo [id number]  HISTORY OF PRESENT ILLNESS: I had the pleasure of seeing Travis Castillo in follow-up in the neurology clinic on 06/06/2016.  The patient was last seen 8 months ago for multiple sclerosis. On his last visit, he was reporting injection site reactions of bleeding and muscle twitching with Copaxone. He reports that he is not taking this anymore and stopped it 3 months ago. He presents today due to neck pain that is "deep in the middle," "like there is pole stuck right in the middle." No Lhermitte's sign. He feels the pain is in the center of his body rather than in his neck or back. Massage does not help. He woke up with a crick in his neck on Monday then the next day his neck was sore, spreading down his shoulders and to the sides. He was having constant 9/10 neck pain where he could not turn his head sideways. Symptoms started getting better last night, he is now able to turn his head, with 5/10 pain. He lifted weights on Saturday, but states that he lifts weights twice a week. He has had intermittent numbness when he puts pressure on his elbows, relieved with shaking his limbs. He denies any vision changes or vision loss. He has noticed that his strength has declined a little, his grip in both hands is weaker, he can't hold things tightly. He denies any bowel/bladder changes, significant headaches. He has occasional dizziness described as feeling woozy.   HPI: This is a very pleasant 35 yo LH man with no significant past medical history who presented for evaluation of possible Lhermitte's sign and numbness last 02/2014. Symptoms started around May 2015 when he had numbness in the fingers of his right hand. After a week, it spread to his entire hand, then a week later, he noticed the numbness in his left hand. The episode lasted 2 weeks. Around June 2015, he noticed that every time he would flex his neck, he feels a sensation of vibration radiating  from his neck down his back all the way to his feet. He denies any neck pain, but describes a vibrating sensation in his neck and in his thighs Symptoms resolve when he lifts his head back to neutral position. He has occasional numbness his toes. He would get numbness in both legs when bending forward while sitting, which he attributed to positional changes. No facial involvement. He denies similar symptoms in the past. He denies any vision changes/loss of vision in the past. He is very active with basketball, weights, and cardio. He noticed that recently when he does push-ups, his bones feel a little more sore.   His MRI brain with and without contrast had shown extensive periventricular and scattered subcortical white matter changes bilaterally. There is asymmetric white matter disease adjacent to the atrium of the left lateral ventricle. There is extensive involvement of the colossoseptal margin. There is some diffusion signal abnormality within the area involving the right coronal radiata. A 7 x 6 x 2 mm focus of enhancement is associated with a white matter lesion in the anterior left frontal lobe. There are 2 punctate areas of enhancement associated with the area of signal abnormality in the right coronal radiata. MRI C-spine and T-spine normal, no abnormal signal or enhancement seen. He had a lumbar puncture which showed >5 oligoclonal bands. All other studies negative as noted previously. He did have a post-LP headache for several days but did not proceed with blood  patch. Vitamin D level 40 I personally reviewed repeat MRI brain with and without contrast which was overall stable, showing extensive white matter changes bilaterally, 7mm lesion in the left midbrain, asymmetric left-sided T2 changes in the cerebellum. There is extensive involvement of the colossal septal margin. The 3mm focal area of enhancement in the left frontal operculum is stable. No new lesions.   PAST MEDICAL HISTORY: Past  Medical History:  Diagnosis Date  . MS (multiple sclerosis) (HCC)     MEDICATIONS: Current Outpatient Prescriptions on File Prior to Visit  Medication Sig Dispense Refill  . COPAXONE 40 MG/ML SOSY Every other daily    . modafinil (PROVIGIL) 100 MG tablet Take 1 tablet 1 hour before work shift (Patient not taking: Reported on 06/06/2016) 30 tablet 5   No current facility-administered medications on file prior to visit.     ALLERGIES: Allergies  Allergen Reactions  . Mucinex [Guaifenesin Er] Other (See Comments)    Projectile vomiting    FAMILY HISTORY: Family History  Problem Relation Age of Onset  . Hypertension Father   . Diabetes Paternal Aunt     SOCIAL HISTORY: Social History   Social History  . Marital status: Married    Spouse name: N/A  . Number of children: N/A  . Years of education: N/A   Occupational History  . Not on file.   Social History Main Topics  . Smoking status: Current Every Day Smoker    Types: Cigarettes  . Smokeless tobacco: Never Used  . Alcohol use 0.0 oz/week     Comment: social  . Drug use: No  . Sexual activity: Not on file   Other Topics Concern  . Not on file   Social History Narrative   Lives with wife and children in a two story home.  Works for [employer].    REVIEW OF SYSTEMS: Constitutional: No fevers, chills, or sweats, + generalized fatigue,no change in appetite Eyes: No visual changes, double vision, eye pain Ear, nose and throat: No hearing loss, ear pain, nasal congestion, sore throat Cardiovascular: No chest pain, palpitations Respiratory:  No shortness of breath at rest or with exertion, wheezes GastrointestinaI: No nausea, vomiting, diarrhea, abdominal pain, fecal incontinence Genitourinary:  No dysuria, urinary retention or frequency Musculoskeletal:  No neck pain, back pain Integumentary: No rash, pruritus, skin lesions Neurological: as above Psychiatric: + depression, insomnia, anxiety Endocrine: No  palpitations, fatigue, diaphoresis, mood swings, change in appetite, change in weight, increased thirst Hematologic/Lymphatic:  No anemia, purpura, petechiae. Allergic/Immunologic: no itchy/runny eyes, nasal congestion, recent allergic reactions, rashes  PHYSICAL EXAM: Vitals:   06/06/16 1031  BP: 128/82  Pulse: 92   General: No acute distress Head:  Normocephalic/atraumatic Neck: supple, no paraspinal tenderness, full range of motion Heart:  Regular rate and rhythm Lungs:  Clear to auscultation bilaterally Back: No paraspinal tenderness Skin/Extremities: No rash, no edema Neurological Exam: alert and oriented to person, place, and time. No aphasia or dysarthria. Fund of knowledge is appropriate.  Recent and remote memory are intact.  Attention and concentration are normal.    Able to name objects and repeat phrases. Cranial nerves: Pupils equal, round, reactive to light.  Fundoscopic exam unremarkable, no papilledema. Extraocular movements intact with no nystagmus. Visual fields full. Facial sensation intact. No facial asymmetry. Tongue, uvula, palate midline.  Motor: Bulk and tone normal, muscle strength 5/5 throughout with no pronator drift.  Sensation to light touch, temperature  intact.  No extinction to double simultaneous stimulation.  Deep  tendon reflexes 2+ throughout, toes downgoing.  Finger to nose testing intact.  Gait narrow-based and steady, able to tandem walk adequately.  Romberg negative. Negative Lhermitte's sign.  IMPRESSION: This is a very pleasant 35 yo LH man with no significant past medication history, who initially presented with paresthesias that lasted for 2 weeks that started in May 2015, then had symptoms suggestive of Lhermitte's sign in June 2015. His MRI brain in July 2015 showed extensive white matter changes bilaterally and in the callososeptal region, with small foci of abnormal enhancement in the left frontal and right corona radiata. CSF showed >5 oligoclonal  bands. Findings consistent with a diagnosis of Multiple Sclerosis. Repeat MRI brain 07/2015 stable. He has had injection site reactions to Copaxone, affecting compliance. He will be started on Gilenya, side effects were discussed. He is reporting new symptoms of neck pain, suggestive of musculoskeletal cause rather than MS flare. A 1-week course of Prednisone will be ordered, he may take prn Flexeril as needed. He will follow-up in 3 months.   Thank you for allowing me to participate in his care.  Please do not hesitate to call for any questions or concerns.  The duration of this appointment visit was 25 minutes of face-to-face time with the patient.  Greater than 50% of this time was spent in counseling, explanation of diagnosis, planning of further management, and coordination of care.   Patrcia DollyKaren Syrena Burges, M.D.   CC: Dr. Tenny Crawoss

## 2016-06-06 NOTE — Patient Instructions (Addendum)
1. Start Prednisone 20mg : Take 3 tabs on day 1, 2-1/2 tabs on day 2, 2 tabs on day 3, 1-1/2 tabs on day 4, 1 tab on day 5, 1/2 tab on days 6 and 7, then stop.   2. May take muscle relaxant Flexeril 5mg  every 8 hours as needed for neck stiffness  3. We will get you started with Gilenya, let's start the process with bloodwork and ECG testing.  4. Follow-up in 3 months, call our office for any changes

## 2016-09-26 ENCOUNTER — Ambulatory Visit: Payer: 59 | Admitting: Neurology

## 2016-12-10 ENCOUNTER — Telehealth: Payer: Self-pay | Admitting: Neurology

## 2016-12-10 NOTE — Telephone Encounter (Signed)
It doesn't look like Dr. Karel Jarvis has anything available sooner than his scheduled appointment.  Can you put him on the wait list please?

## 2016-12-10 NOTE — Telephone Encounter (Signed)
Caller: PT  Urgent? Yes  Reason for the call: PT called and said he is having a lot of numbness and wanted to come in sooner to see Dr Karel Jarvis

## 2016-12-17 ENCOUNTER — Telehealth: Payer: Self-pay

## 2016-12-17 ENCOUNTER — Encounter: Payer: Self-pay | Admitting: Neurology

## 2016-12-17 ENCOUNTER — Ambulatory Visit (INDEPENDENT_AMBULATORY_CARE_PROVIDER_SITE_OTHER): Payer: 59 | Admitting: Neurology

## 2016-12-17 VITALS — BP 118/72 | HR 91 | Ht 77.0 in | Wt 203.0 lb

## 2016-12-17 DIAGNOSIS — G35 Multiple sclerosis: Secondary | ICD-10-CM | POA: Diagnosis not present

## 2016-12-17 NOTE — Telephone Encounter (Signed)
Called pt to inform him that I have scheduled his ECG at [facility] for Thursday, May17 at 2:30pm.  Pt was appreciative of my call.

## 2016-12-17 NOTE — Patient Instructions (Signed)
1. Start [facility]rol 1000mg  x [facility] 2. Schedule MRI brain with and without contrast, MRI cervical spine with and without contrast, MRI thoracic spine with and without contrast 3. Start Gilenya ASAP 4. Follow-up in 3 months, call for any changes

## 2016-12-17 NOTE — Progress Notes (Signed)
NEUROLOGY FOLLOW UP OFFICE NOTE  Travis Castillo [id number]  HISTORY OF PRESENT ILLNESS: I had the pleasure of seeing Travis Castillo in follow-up in the neurology clinic on 12/17/2016.  The patient was last seen 6 months ago for multiple sclerosis. On his last visit, we had decided to start Gilenya due to injection site reactions of bleeding and muscle twitching with Copaxone. He reports that he never started the medication and was again lost to follow-up until he started having a change in symptoms last week. He woke up Monday last week with numbness in both hands up to the wrists, both feet up to his knees, around his mid-chest through the abdomen as well as in the perineal region. He feels like he has a "wedgie." He felt like he was wearing a weighted vest. His hands feel very uncomfortable when he holds something, "they feel like ice." He had initially presented in 2015 with Lhermitte's sign, leading to a diagnosis of MS, this had resolved, but recurred last Monday as well. Everytime he bends his head down, he has a shooting sensation going down to his feet. He feels his grip strength is weaker. He denies any falls. No bowel/bladder dysfunction. He denies any headaches, dizziness, vision changes, no loss of vision. He reports home life is good, they continue to go on occasional vacations to relax.   HPI: This is a very pleasant 36 yo LH man with no significant past medical history who presented for evaluation of possible Lhermitte's sign and numbness last 02/2014. Symptoms started around May 2015 when he had numbness in the fingers of his right hand. After a week, it spread to his entire hand, then a week later, he noticed the numbness in his left hand. The episode lasted 2 weeks. Around June 2015, he noticed that every time he would flex his neck, he feels a sensation of vibration radiating from his neck down his back all the way to his feet. He denies any neck pain, but describes a vibrating sensation  in his neck and in his thighs Symptoms resolve when he lifts his head back to neutral position. He has occasional numbness his toes. He would get numbness in both legs when bending forward while sitting, which he attributed to positional changes. No facial involvement. He denies similar symptoms in the past. He denies any vision changes/loss of vision in the past. He is very active with basketball, weights, and cardio. He noticed that recently when he does push-ups, his bones feel a little more sore.   His MRI brain with and without contrast had shown extensive periventricular and scattered subcortical white matter changes bilaterally. There is asymmetric white matter disease adjacent to the atrium of the left lateral ventricle. There is extensive involvement of the colossoseptal margin. There is some diffusion signal abnormality within the area involving the right coronal radiata. A 7 x 6 x 2 mm focus of enhancement is associated with a white matter lesion in the anterior left frontal lobe. There are 2 punctate areas of enhancement associated with the area of signal abnormality in the right coronal radiata. MRI C-spine and T-spine normal, no abnormal signal or enhancement seen. He had a lumbar puncture which showed >5 oligoclonal bands. All other studies negative as noted previously. He did have a post-LP headache for several days but did not proceed with blood patch. Vitamin D level 40 I personally reviewed repeat MRI brain with and without contrast which was overall stable, showing extensive white matter changes bilaterally,  7mm lesion in the left midbrain, asymmetric left-sided T2 changes in the cerebellum. There is extensive involvement of the colossal septal margin. The 3mm focal area of enhancement in the left frontal operculum is stable. No new lesions.   PAST MEDICAL HISTORY: Past Medical History:  Diagnosis Date  . MS (multiple sclerosis) (HCC)     MEDICATIONS: Current Outpatient  Prescriptions on File Prior to Visit  Medication Sig Dispense Refill  . cyclobenzaprine (FLEXERIL) 5 MG tablet Take 1 tablet (5 mg total) by mouth every 8 (eight) hours as needed for muscle spasms. (Patient not taking: Reported on 12/17/2016) 30 tablet 3  . modafinil (PROVIGIL) 100 MG tablet Take 1 tablet 1 hour before work shift (Patient not taking: Reported on 06/06/2016) 30 tablet 5  . predniSONE (DELTASONE) 20 MG tablet Prednisone 20mg : Take 3 tabs on day 1, 2-1/2 tabs on day 2, 2 tabs on day 3, 1-1/2 tabs on day 4, 1 tab on day 5, 1/2 tab on days 6 and 7, then stop. (Patient not taking: Reported on 12/17/2016) 11 tablet 0   No current facility-administered medications on file prior to visit.     ALLERGIES: Allergies  Allergen Reactions  . Mucinex [Guaifenesin Er] Other (See Comments)    Projectile vomiting    FAMILY HISTORY: Family History  Problem Relation Age of Onset  . Hypertension Father   . Diabetes Paternal Aunt     SOCIAL HISTORY: Social History   Social History  . Marital status: Married    Spouse name: N/A  . Number of children: N/A  . Years of education: N/A   Occupational History  . Not on file.   Social History Main Topics  . Smoking status: Current Every Day Smoker    Types: Cigarettes  . Smokeless tobacco: Never Used  . Alcohol use 0.0 oz/week     Comment: social  . Drug use: No  . Sexual activity: Not on file   Other Topics Concern  . Not on file   Social History Narrative   Lives with wife and children in a two story home.  Works for [employer].    REVIEW OF SYSTEMS: Constitutional: No fevers, chills, or sweats, + generalized fatigue,no change in appetite Eyes: No visual changes, double vision, eye pain Ear, nose and throat: No hearing loss, ear pain, nasal congestion, sore throat Cardiovascular: No chest pain, palpitations Respiratory:  No shortness of breath at rest or with exertion, wheezes GastrointestinaI: No nausea, vomiting,  diarrhea, abdominal pain, fecal incontinence Genitourinary:  No dysuria, urinary retention or frequency Musculoskeletal:  No neck pain, back pain Integumentary: No rash, pruritus, skin lesions Neurological: as above Psychiatric: + depression, insomnia, anxiety Endocrine: No palpitations, fatigue, diaphoresis, mood swings, change in appetite, change in weight, increased thirst Hematologic/Lymphatic:  No anemia, purpura, petechiae. Allergic/Immunologic: no itchy/runny eyes, nasal congestion, recent allergic reactions, rashes  PHYSICAL EXAM: Vitals:   12/17/16 1113  BP: 118/72  Pulse: 91   General: No acute distress Head:  Normocephalic/atraumatic Neck: supple, no paraspinal tenderness, full range of motion, +Lhermitte sign Heart:  Regular rate and rhythm Lungs:  Clear to auscultation bilaterally Back: No paraspinal tenderness Skin/Extremities: No rash, no edema Neurological Exam: alert and oriented to person, place, and time. No aphasia or dysarthria. Fund of knowledge is appropriate.  Recent and remote memory are intact.  Attention and concentration are normal.    Able to name objects and repeat phrases. Cranial nerves: Pupils equal, round, reactive to light.  Fundoscopic exam unremarkable,  no papilledema. Extraocular movements intact with no nystagmus. Visual fields full. Facial sensation intact. No facial asymmetry. Tongue, uvula, palate midline.  Motor: Bulk and tone normal, muscle strength 5/5 throughout with no pronator drift.  Sensation intact to all modalities on both UE, hyperesthesia to pin on both feet, no sensory level over the trunk/abdomen/back. No extinction to double simultaneous stimulation.  Deep tendon reflexes +1 on both UE, +2 both LE, toes downgoing.  Finger to nose testing intact.  Gait narrow-based and steady, able to tandem walk adequately.  Romberg negative. Positive Lhermitte's sign.  IMPRESSION: This is a very pleasant 36 yo LH man with no significant past  medication history, who initially presented with paresthesias that lasted for 2 weeks that started in May 2015, then had symptoms suggestive of Lhermitte's sign in June 2015. His MRI brain in July 2015 showed extensive white matter changes bilaterally and in the callososeptal region, with small foci of abnormal enhancement in the left frontal and right corona radiata. CSF showed >5 oligoclonal bands. Findings consistent with a diagnosis of Multiple Sclerosis. Repeat MRI brain 07/2015 stable. He has had injection site reactions to Copaxone, affecting compliance, but did not start Gilenya as recommended last November 2017. He now presents with symptoms suggestive of MS flare with paresthesias on both sides, as well as over the trunk/abdomen, +Lhermitte sign. MRI brain with and without contrast, cervical and thoracic spine with and without contrast will be ordered. He will be scheduled for a 3-day course of IV Solumedrol. We have had several extensive discussions in the past about needing to be on disease modifying agents for MS, he now again agrees to start the Gilenya. He will follow-up in 3 months and knows to call for any changes.  Thank you for allowing me to participate in his care.  Please do not hesitate to call for any questions or concerns.  The duration of this appointment visit was 25 minutes of face-to-face time with the patient.  Greater than 50% of this time was spent in counseling, explanation of diagnosis, planning of further management, and coordination of care.   Patrcia Dolly, M.D.   CC: Dr. Tenny Craw

## 2016-12-20 ENCOUNTER — Ambulatory Visit (HOSPITAL_COMMUNITY)
Admission: RE | Admit: 2016-12-20 | Discharge: 2016-12-20 | Disposition: A | Discharge status: Home / Self Care | Payer: 59 | Source: Ambulatory Visit | Attending: Neurology | Admitting: Neurology

## 2016-12-20 DIAGNOSIS — G35 Multiple sclerosis: Secondary | ICD-10-CM | POA: Insufficient documentation

## 2016-12-24 ENCOUNTER — Other Ambulatory Visit (HOSPITAL_COMMUNITY): Payer: Self-pay | Admitting: *Deleted

## 2016-12-25 ENCOUNTER — Ambulatory Visit (HOSPITAL_COMMUNITY)
Admission: RE | Admit: 2016-12-25 | Discharge: 2016-12-25 | Disposition: A | Discharge status: Home / Self Care | Payer: 59 | Source: Ambulatory Visit | Attending: Neurology | Admitting: Neurology

## 2016-12-25 ENCOUNTER — Other Ambulatory Visit (HOSPITAL_COMMUNITY): Payer: Self-pay | Admitting: *Deleted

## 2016-12-25 DIAGNOSIS — G35 Multiple sclerosis: Secondary | ICD-10-CM | POA: Diagnosis not present

## 2016-12-25 MED ORDER — SODIUM CHLORIDE 0.9 % IV SOLN
1000.0000 mg | Freq: Every day | INTRAVENOUS | Status: DC
  Administered 2016-12-25: 1000 mg via INTRAVENOUS
  Filled 2016-12-25: qty 8

## 2016-12-26 ENCOUNTER — Encounter (HOSPITAL_COMMUNITY)
Admission: RE | Admit: 2016-12-26 | Discharge: 2016-12-26 | Disposition: A | Discharge status: Home / Self Care | Payer: 59 | Source: Ambulatory Visit | Attending: Neurology | Admitting: Neurology

## 2016-12-26 DIAGNOSIS — G35 Multiple sclerosis: Secondary | ICD-10-CM | POA: Diagnosis present

## 2016-12-26 MED ORDER — SODIUM CHLORIDE 0.9 % IV SOLN
1000.0000 mg | Freq: Every day | INTRAVENOUS | Status: DC
  Administered 2016-12-26: 1000 mg via INTRAVENOUS
  Filled 2016-12-26: qty 8

## 2016-12-27 ENCOUNTER — Encounter (HOSPITAL_COMMUNITY)
Admission: RE | Admit: 2016-12-27 | Discharge: 2016-12-27 | Disposition: A | Discharge status: Home / Self Care | Payer: 59 | Source: Ambulatory Visit | Attending: Neurology | Admitting: Neurology

## 2016-12-27 DIAGNOSIS — G35 Multiple sclerosis: Secondary | ICD-10-CM | POA: Diagnosis not present

## 2016-12-27 MED ORDER — SODIUM CHLORIDE 0.9 % IV SOLN
1000.0000 mg | Freq: Every day | INTRAVENOUS | Status: AC
  Administered 2016-12-27: 1000 mg via INTRAVENOUS
  Filled 2016-12-27: qty 8

## 2017-01-10 ENCOUNTER — Telehealth: Payer: Self-pay | Admitting: Neurology

## 2017-01-10 NOTE — Telephone Encounter (Signed)
Patient wants to check on the status of the gilenya  He is has not had rx for the [place]

## 2017-01-14 ENCOUNTER — Ambulatory Visit: Payer: 59 | Admitting: Neurology

## 2017-01-14 NOTE — Telephone Encounter (Signed)
Called pt to relay message below.  VM full, unable to leave message

## 2017-01-14 NOTE — Telephone Encounter (Signed)
Spoke with Jola Babinski from the [facility].  She states that they have left several messages for pt but have not heard anything back from him.  Will contact pt to let him know to call them.

## 2017-01-21 ENCOUNTER — Ambulatory Visit: Payer: 59 | Admitting: Neurology

## 2017-01-22 ENCOUNTER — Telehealth: Payer: Self-pay | Admitting: Neurology

## 2017-01-22 NOTE — Telephone Encounter (Signed)
Verbal order [facility]

## 2017-01-23 ENCOUNTER — Other Ambulatory Visit: Payer: Self-pay

## 2017-01-23 DIAGNOSIS — G35 Multiple sclerosis: Secondary | ICD-10-CM

## 2017-01-23 NOTE — Telephone Encounter (Signed)
Spoke with pt.  He stated that he has had an eye exam within the past year referred by Dr. Karel Jarvis.  Looking into [facility] the last referral to opthalmology was back in 2015.  Referral sent while I was on the phone with pt, pt notified.

## 2017-01-23 NOTE — Telephone Encounter (Signed)
-----   Message from Van Clines, MD sent at 01/23/2017  8:58 AM EDT ----- Regarding: eye exam It's been a long time we have been trying to get him on Gilenya, he has to also see an eye doctor before starting the medicine, has he seen one yet? If not, pls send referral and encourage him to go before starting medicine. Thanks

## 2017-01-24 ENCOUNTER — Telehealth: Payer: Self-pay | Admitting: Neurology

## 2017-01-24 NOTE — Telephone Encounter (Signed)
PT left a voicemail message as well as Josh from [facility] regarding a prescription and either one would like a call regarding this CB#[phone]

## 2017-01-28 ENCOUNTER — Telehealth: Payer: Self-pay | Admitting: Neurology

## 2017-01-28 NOTE — Telephone Encounter (Signed)
Voicemail message left from [facility] asking for a call back with an observation date CB# [phone]

## 2017-01-30 ENCOUNTER — Telehealth: Payer: Self-pay

## 2017-01-30 NOTE — Telephone Encounter (Signed)
Spoke with Chip Boer from [facility] who states that the Gilenya [facility] program is to set up the pt's first observation dosage.  We are still waiting for pt to have eye exam before he can be cleared for first dosage. Chip Boer will contact Gilenya [employer] and talk with them about our contract with the program

## 2017-02-19 ENCOUNTER — Telehealth: Payer: Self-pay | Admitting: Neurology

## 2017-02-19 NOTE — Telephone Encounter (Signed)
Pt has appointment with Dr. Sallye Lat on Tuesday, July 24 @ 11am.  Called pt.  Pt aware of appointment

## 2017-02-19 NOTE — Telephone Encounter (Signed)
Shayn Faillace Self [phone]  Denardo called to see, why he has not heard anything about a referral to a eye doctor. Please call and advice

## 2017-04-01 ENCOUNTER — Ambulatory Visit: Payer: 59 | Admitting: Neurology

## 2018-02-10 ENCOUNTER — Other Ambulatory Visit: Payer: Self-pay

## 2018-02-10 ENCOUNTER — Encounter: Payer: Self-pay | Admitting: Neurology

## 2018-02-10 ENCOUNTER — Ambulatory Visit (INDEPENDENT_AMBULATORY_CARE_PROVIDER_SITE_OTHER): Payer: 59 | Admitting: Neurology

## 2018-02-10 VITALS — BP 118/90 | HR 80 | Ht 77.0 in | Wt 202.0 lb

## 2018-02-10 DIAGNOSIS — G35 Multiple sclerosis: Secondary | ICD-10-CM | POA: Diagnosis not present

## 2018-02-10 NOTE — Progress Notes (Signed)
NEUROLOGY FOLLOW UP OFFICE NOTE  Travis Castillo [id number]  DOB: Oct 13, 1980  HISTORY OF PRESENT ILLNESS: I had the pleasure of seeing Travis Castillo in follow-up in the neurology clinic on 02/10/2018.  The patient was last seen more than a year ago for multiple sclerosis. He has been lost to follow-up due to various family issues and presents today interested in starting Gilenya, which we had discussed on his last visit a year ago. He had injection site reactions of bleeding and muscle twitching with Copaxone. On his last visit, he reports numbness in both hands, feet, mid-chest region. MRI was ordered but he did not proceed. Gilenya process was started, he reports seeing the eye doctor but did not follow-up with [place] after to proceed with Gilenya treatment. He denies any further similar episodes but has occasional numbness in his hands, mostly when waking up from sleep. He has noticed a little diffuse weakness, his strength is not what it used to be, it is harder to hold on to certain things. He denies any vision changes. No bowel/bladder dysfunction. No fals. He had initially presented with Lhermitte's sign in 2015, he had this briefly when he presented a year ago, none since. He denies any headaches, dizziness. Memory is terrible, it has not affected work but he has systems in place to aid him.  HPI: This is a very pleasant 37 yo LH man with no significant past medical history who presented for evaluation of possible Lhermitte's sign and numbness last 02/2014. Symptoms started around May 2015 when he had numbness in the fingers of his right hand. After a week, it spread to his entire hand, then a week later, he noticed the numbness in his left hand. The episode lasted 2 weeks. Around June 2015, he noticed that every time he would flex his neck, he feels a sensation of vibration radiating from his neck down his back all the way to his feet. He denies any neck pain, but describes a vibrating sensation in his  neck and in his thighs Symptoms resolve when he lifts his head back to neutral position. He has occasional numbness his toes. He would get numbness in both legs when bending forward while sitting, which he attributed to positional changes. No facial involvement. He denies similar symptoms in the past. He denies any vision changes/loss of vision in the past. He is very active with basketball, weights, and cardio. He noticed that recently when he does push-ups, his bones feel a little more sore.   His MRI brain with and without contrast had shown extensive periventricular and scattered subcortical white matter changes bilaterally. There is asymmetric white matter disease adjacent to the atrium of the left lateral ventricle. There is extensive involvement of the colossoseptal margin. There is some diffusion signal abnormality within the area involving the right coronal radiata. A 7 x 6 x 2 mm focus of enhancement is associated with a white matter lesion in the anterior left frontal lobe. There are 2 punctate areas of enhancement associated with the area of signal abnormality in the right coronal radiata. MRI C-spine and T-spine normal, no abnormal signal or enhancement seen. He had a lumbar puncture which showed >5 oligoclonal bands. All other studies negative as noted previously. He did have a post-LP headache for several days but did not proceed with blood patch. Vitamin D level 40 I personally reviewed repeat MRI brain with and without contrast which was overall stable, showing extensive white matter changes bilaterally, 7mm lesion in the  left midbrain, asymmetric left-sided T2 changes in the cerebellum. There is extensive involvement of the colossal septal margin. The 54mm focal area of enhancement in the left frontal operculum is stable. No new lesions.   PAST MEDICAL HISTORY: Past Medical History:  Diagnosis Date  . MS (multiple sclerosis) (HCC)     MEDICATIONS: Current Outpatient Medications on File  Prior to Visit  Medication Sig Dispense Refill  . cyclobenzaprine (FLEXERIL) 5 MG tablet Take 1 tablet (5 mg total) by mouth every 8 (eight) hours as needed for muscle spasms. (Patient not taking: Reported on 12/17/2016) 30 tablet 3  . modafinil (PROVIGIL) 100 MG tablet Take 1 tablet 1 hour before work shift (Patient not taking: Reported on 06/06/2016) 30 tablet 5  . predniSONE (DELTASONE) 20 MG tablet Prednisone 20mg : Take 3 tabs on day 1, 2-1/2 tabs on day 2, 2 tabs on day 3, 1-1/2 tabs on day 4, 1 tab on day 5, 1/2 tab on days 6 and 7, then stop. (Patient not taking: Reported on 12/17/2016) 11 tablet 0   No current facility-administered medications on file prior to visit.     ALLERGIES: Allergies  Allergen Reactions  . Mucinex [Guaifenesin Er] Other (See Comments)    Projectile vomiting    FAMILY HISTORY: Family History  Problem Relation Age of Onset  . Hypertension Father   . Diabetes Paternal Aunt     SOCIAL HISTORY: Social History   Socioeconomic History  . Marital status: Married    Spouse name: Not on file  . Number of children: Not on file  . Years of education: Not on file  . Highest education level: Not on file  Occupational History  . Not on file  Social Needs  . Financial resource strain: Not on file  . Food insecurity:    Worry: Not on file    Inability: Not on file  . Transportation needs:    Medical: Not on file    Non-medical: Not on file  Tobacco Use  . Smoking status: Current Every Day Smoker    Types: Cigarettes  . Smokeless tobacco: Never Used  Substance and Sexual Activity  . Alcohol use: Yes    Alcohol/week: 0.0 oz    Comment: social  . Drug use: No  . Sexual activity: Not on file  Lifestyle  . Physical activity:    Days per week: Not on file    Minutes per session: Not on file  . Stress: Not on file  Relationships  . Social connections:    Talks on phone: Not on file    Gets together: Not on file    Attends religious service: Not on  file    Active member of club or organization: Not on file    Attends meetings of clubs or organizations: Not on file    Relationship status: Not on file  . Intimate partner violence:    Fear of current or ex partner: Not on file    Emotionally abused: Not on file    Physically abused: Not on file    Forced sexual activity: Not on file  Other Topics Concern  . Not on file  Social History Narrative   Lives with wife and children in a two story home.  Works for [employer].    REVIEW OF SYSTEMS: Constitutional: No fevers, chills, or sweats, + generalized fatigue,no change in appetite Eyes: No visual changes, double vision, eye pain Ear, nose and throat: No hearing loss, ear pain, nasal congestion, sore  throat Cardiovascular: No chest pain, palpitations Respiratory:  No shortness of breath at rest or with exertion, wheezes GastrointestinaI: No nausea, vomiting, diarrhea, abdominal pain, fecal incontinence Genitourinary:  No dysuria, urinary retention or frequency Musculoskeletal:  No neck pain, back pain Integumentary: No rash, pruritus, skin lesions Neurological: as above Psychiatric: No depression, insomnia, anxiety Endocrine: No palpitations, fatigue, diaphoresis, mood swings, change in appetite, change in weight, increased thirst Hematologic/Lymphatic:  No anemia, purpura, petechiae. Allergic/Immunologic: no itchy/runny eyes, nasal congestion, recent allergic reactions, rashes  PHYSICAL EXAM: Vitals:   02/10/18 0938  BP: 118/90  Pulse: 80  SpO2: 98%   General: No acute distress Head:  Normocephalic/atraumatic Neck: supple, no paraspinal tenderness, full range of motion, negative Lhermitte sign Heart:  Regular rate and rhythm Lungs:  Clear to auscultation bilaterally Back: No paraspinal tenderness Skin/Extremities: No rash, no edema Neurological Exam: alert and oriented to person, place, and time. No aphasia or dysarthria. Fund of knowledge is appropriate.  Recent and  remote memory are intact.  Attention and concentration are normal.    Able to name objects and repeat phrases. Cranial nerves: Pupils equal, round, reactive to light.  Fundoscopic exam unremarkable, no papilledema. Extraocular movements intact with no nystagmus. Visual fields full. Facial sensation intact. No facial asymmetry. Tongue, uvula, palate midline.  Motor: Bulk and tone normal, muscle strength 5/5 throughout with no pronator drift.  Sensation intact to cold, pin on both UE and LE. No extinction to double simultaneous stimulation.  Deep tendon reflexes +1 on both UE, +2 both LE, toes downgoing.  Finger to nose testing intact.  Gait narrow-based and steady, able to tandem walk adequately.  Romberg negative. Negative Lhermitte's sign.  IMPRESSION: This is a very pleasant 37 yo LH man with no significant past medication history, who initially presented with paresthesias that lasted for 2 weeks that started in May 2015, then had symptoms suggestive of Lhermitte's sign in June 2015. His MRI brain in July 2015 showed extensive white matter changes bilaterally and in the callososeptal region, with small foci of abnormal enhancement in the left frontal and right corona radiata. CSF showed >5 oligoclonal bands. Findings consistent with a diagnosis of Multiple Sclerosis. Repeat MRI brain 07/2015 stable. He had injection site reactions to Copaxone, affecting compliance, but did not start Gilenya as recommended last November 2017 and again in May 2018. It appears his last MS flare was in May 2018. He did not do MRI brain at that time, we will schedule interval MRI brain with and without contrast. We have had several extensive discussions in the past about needing to be on disease modifying agents for MS, he will proceed with eye exam then start Gilenya as planned. He will follow-up in 3 months and knows to call for any changes.  Thank you for allowing me to participate in his care.  Please do not hesitate to call  for any questions or concerns.  The duration of this appointment visit was 30 minutes of face-to-face time with the patient.  Greater than 50% of this time was spent in counseling, explanation of diagnosis, planning of further management, and coordination of care.   Patrcia Dolly, M.D.   CC: Dr. Tenny Craw

## 2018-02-10 NOTE — Patient Instructions (Addendum)
1. Schedule MRI brain with and without contrast  We have sent a referral to [facility] for your MRI and they will call you directly to schedule your appt. They are located at [address] [facility]. If you need to contact them directly please call [phone].   2. We will check on Dr. Laruth Bouchard note, if still valid time-wise, let's proceed with getting on Gilenya asap 3. Follow-up in 3 months, call for any changes

## 2018-02-23 ENCOUNTER — Ambulatory Visit
Admission: RE | Admit: 2018-02-23 | Discharge: 2018-02-23 | Disposition: A | Discharge status: Home / Self Care | Payer: 59 | Source: Ambulatory Visit | Attending: Neurology | Admitting: Neurology

## 2018-02-23 DIAGNOSIS — G35 Multiple sclerosis: Secondary | ICD-10-CM

## 2018-02-23 MED ORDER — GADOBENATE DIMEGLUMINE 529 MG/ML IV SOLN
18.0000 mL | Freq: Once | INTRAVENOUS | Status: AC | PRN
  Administered 2018-02-23: 18 mL via INTRAVENOUS

## 2018-02-24 ENCOUNTER — Telehealth: Payer: Self-pay

## 2018-02-24 NOTE — Telephone Encounter (Signed)
Patient aware of MRI results and recommendations.  He is going to contact Dr Lucious Groves office and get his records to start the Gilenya process.

## 2018-02-24 NOTE — Telephone Encounter (Signed)
-----   Message from Van Clines, MD sent at 02/24/2018 11:33 AM EDT ----- Pls let Travis Castillo know the MRI brain did not show any changes from 2016, we want to keep it that way so it is important he start the Gilenya. Pls request for records from his eye doctor (I believe Dr. Dione Booze) so we can proceed with getting Gilenya ordered. Thanks

## 2018-03-28 ENCOUNTER — Telehealth: Payer: Self-pay | Admitting: Neurology

## 2018-03-28 DIAGNOSIS — G35 Multiple sclerosis: Secondary | ICD-10-CM

## 2018-03-28 NOTE — Telephone Encounter (Signed)
Patient state that he saw Dr Karel Jarvis a couple of months ago and was trying to find out about getting his medication Gilenya back started

## 2018-04-09 NOTE — Telephone Encounter (Signed)
Patient left a VM today because he has not heard anything back about this matter and he would like to talk to someone

## 2018-04-10 NOTE — Telephone Encounter (Signed)
Referral sent to Dr. Wynell Balloon for eye exam.  Spoke with pt advising him that Dr. Isabell Jarvis office will be in touch with him about appointment.  Pt states that he will contact our office with appointment information when he receives it.

## 2018-04-24 ENCOUNTER — Other Ambulatory Visit: Payer: Self-pay

## 2018-04-24 MED ORDER — FINGOLIMOD HCL 0.5 MG PO CAPS: 0.5000 mg | ORAL_CAPSULE | Freq: Every day | ORAL | 5 refills | Status: DC

## 2018-05-15 ENCOUNTER — Encounter: Payer: Self-pay | Admitting: Neurology

## 2018-05-15 ENCOUNTER — Ambulatory Visit (INDEPENDENT_AMBULATORY_CARE_PROVIDER_SITE_OTHER): Payer: 59 | Admitting: Neurology

## 2018-05-15 ENCOUNTER — Other Ambulatory Visit: Payer: Self-pay

## 2018-05-15 VITALS — BP 120/76 | HR 80 | Ht 77.0 in | Wt 205.0 lb

## 2018-05-15 DIAGNOSIS — G35 Multiple sclerosis: Secondary | ICD-10-CM

## 2018-05-15 NOTE — Progress Notes (Signed)
NEUROLOGY FOLLOW UP OFFICE NOTE  Sarthak Rubenstein [id number]  DOB: Dec 19, 1980  HISTORY OF PRESENT ILLNESS: I had the pleasure of seeing Kathleen Likins in follow-up in the neurology clinic on 05/15/2018.  The patient was last seen 3 months ago for multiple sclerosis. On his last visit, he again reported being ready to start Gilenya, he had injection site reactions of bleeding and muscle twitching with Copaxone. He had previously reported numbness in both hands, feet, mid-chest region but did not proceed with MRI, we re-ordered MRI on last visit, he had it done on 02/23/18 which I personally reviewed, no new lesions compared to 2016 imaging, there were multiple periventricular greater than subcortical white matter lesions again seen, no abnormal enhancement. The largest black hole is in the left frontal subcortical white matter. Since his last visit, he has again seen ophthalmology with normal exam. He has not started Gilenya yet, reporting he has not heard back about first dose observation instructions. He states he has been doing well, he has not had any vision changes, no paresthesias, no weakness, negative Lhermitte sign. He feels good overall. No falls. They may vacation to [place] in February.   HPI: This is a very pleasant 37 yo LH man with no significant past medical history who presented for evaluation of possible Lhermitte's sign and numbness last 02/2014. Symptoms started around May 2015 when he had numbness in the fingers of his right hand. After a week, it spread to his entire hand, then a week later, he noticed the numbness in his left hand. The episode lasted 2 weeks. Around June 2015, he noticed that every time he would flex his neck, he feels a sensation of vibration radiating from his neck down his back all the way to his feet. He denies any neck pain, but describes a vibrating sensation in his neck and in his thighs Symptoms resolve when he lifts his head back to neutral position. He has  occasional numbness his toes. He would get numbness in both legs when bending forward while sitting, which he attributed to positional changes. No facial involvement. He denies similar symptoms in the past. He denies any vision changes/loss of vision in the past. He is very active with basketball, weights, and cardio. He noticed that recently when he does push-ups, his bones feel a little more sore.   His MRI brain with and without contrast had shown extensive periventricular and scattered subcortical white matter changes bilaterally. There is asymmetric white matter disease adjacent to the atrium of the left lateral ventricle. There is extensive involvement of the colossoseptal margin. There is some diffusion signal abnormality within the area involving the right coronal radiata. A 7 x 6 x 2 mm focus of enhancement is associated with a white matter lesion in the anterior left frontal lobe. There are 2 punctate areas of enhancement associated with the area of signal abnormality in the right coronal radiata. MRI C-spine and T-spine normal, no abnormal signal or enhancement seen. He had a lumbar puncture which showed >5 oligoclonal bands. All other studies negative as noted previously. He did have a post-LP headache for several days but did not proceed with blood patch. Vitamin D level 40 I personally reviewed repeat MRI brain with and without contrast which was overall stable, showing extensive white matter changes bilaterally, 7mm lesion in the left midbrain, asymmetric left-sided T2 changes in the cerebellum. There is extensive involvement of the colossal septal margin. The 3mm focal area of enhancement in the left frontal  operculum is stable. No new lesions.   PAST MEDICAL HISTORY: Past Medical History:  Diagnosis Date  . MS (multiple sclerosis) (HCC)     MEDICATIONS: Current Outpatient Medications on File Prior to Visit  Medication Sig Dispense Refill  . cyclobenzaprine (FLEXERIL) 5 MG tablet Take  1 tablet (5 mg total) by mouth every 8 (eight) hours as needed for muscle spasms. (Patient not taking: Reported on 12/17/2016) 30 tablet 3  . Fingolimod HCl (GILENYA) 0.5 MG CAPS Take 1 capsule (0.5 mg total) by mouth daily. 30 capsule 5  . modafinil (PROVIGIL) 100 MG tablet Take 1 tablet 1 hour before work shift (Patient not taking: Reported on 06/06/2016) 30 tablet 5  . predniSONE (DELTASONE) 20 MG tablet Prednisone 20mg : Take 3 tabs on day 1, 2-1/2 tabs on day 2, 2 tabs on day 3, 1-1/2 tabs on day 4, 1 tab on day 5, 1/2 tab on days 6 and 7, then stop. (Patient not taking: Reported on 12/17/2016) 11 tablet 0   No current facility-administered medications on file prior to visit.     ALLERGIES: Allergies  Allergen Reactions  . Mucinex [Guaifenesin Er] Other (See Comments)    Projectile vomiting    FAMILY HISTORY: Family History  Problem Relation Age of Onset  . Hypertension Father   . Diabetes Paternal Aunt     SOCIAL HISTORY: Social History   Socioeconomic History  . Marital status: Married    Spouse name: Not on file  . Number of children: Not on file  . Years of education: Not on file  . Highest education level: Not on file  Occupational History  . Not on file  Social Needs  . Financial resource strain: Not on file  . Food insecurity:    Worry: Not on file    Inability: Not on file  . Transportation needs:    Medical: Not on file    Non-medical: Not on file  Tobacco Use  . Smoking status: Current Every Day Smoker    Types: Cigarettes  . Smokeless tobacco: Never Used  Substance and Sexual Activity  . Alcohol use: Yes    Alcohol/week: 0.0 standard drinks    Comment: social  . Drug use: No  . Sexual activity: Not on file  Lifestyle  . Physical activity:    Days per week: Not on file    Minutes per session: Not on file  . Stress: Not on file  Relationships  . Social connections:    Talks on phone: Not on file    Gets together: Not on file    Attends religious  service: Not on file    Active member of club or organization: Not on file    Attends meetings of clubs or organizations: Not on file    Relationship status: Not on file  . Intimate partner violence:    Fear of current or ex partner: Not on file    Emotionally abused: Not on file    Physically abused: Not on file    Forced sexual activity: Not on file  Other Topics Concern  . Not on file  Social History Narrative   Lives with wife and children in a two story home.  Works for [employer].    REVIEW OF SYSTEMS: Constitutional: No fevers, chills, or sweats, generalized fatigue,no change in appetite Eyes: No visual changes, double vision, eye pain Ear, nose and throat: No hearing loss, ear pain, nasal congestion, sore throat Cardiovascular: No chest pain, palpitations Respiratory:  No shortness of breath at rest or with exertion, wheezes GastrointestinaI: No nausea, vomiting, diarrhea, abdominal pain, fecal incontinence Genitourinary:  No dysuria, urinary retention or frequency Musculoskeletal:  No neck pain, back pain Integumentary: No rash, pruritus, skin lesions Neurological: as above Psychiatric: No depression, insomnia, anxiety Endocrine: No palpitations, fatigue, diaphoresis, mood swings, change in appetite, change in weight, increased thirst Hematologic/Lymphatic:  No anemia, purpura, petechiae. Allergic/Immunologic: no itchy/runny eyes, nasal congestion, recent allergic reactions, rashes  PHYSICAL EXAM: Vitals:   05/15/18 1028  BP: 120/76  Pulse: 80  SpO2: 98%   General: No acute distress Head:  Normocephalic/atraumatic Neck: supple, no paraspinal tenderness, full range of motion, negative Lhermitte sign Heart:  Regular rate and rhythm Lungs:  Clear to auscultation bilaterally Back: No paraspinal tenderness Skin/Extremities: No rash, no edema Neurological Exam: alert and oriented to person, place, and time. No aphasia or dysarthria. Fund of knowledge is appropriate.   Recent and remote memory are intact.  Attention and concentration are normal.    Able to name objects and repeat phrases. Cranial nerves: Pupils equal, round, reactive to light. Extraocular movements intact with no nystagmus. Visual fields full. Facial sensation intact. No facial asymmetry. Tongue, uvula, palate midline.  Motor: Bulk and tone normal, muscle strength 5/5 throughout with no pronator drift.  Sensation intact to light touch. No extinction to double simultaneous stimulation.  Deep tendon reflexes +1 on both UE, +2 both LE, toes downgoing.  Finger to nose testing intact.  Gait narrow-based and steady, able to tandem walk adequately.  Romberg negative.   IMPRESSION: This is a very pleasant 37 yo LH man with no significant past medication history, who initially presented with paresthesias that lasted for 2 weeks that started in May 2015, then had symptoms suggestive of Lhermitte's sign in June 2015. His MRI brain in July 2015 showed extensive white matter changes bilaterally and in the callososeptal region, with small foci of abnormal enhancement in the left frontal and right corona radiata. CSF showed >5 oligoclonal bands. Findings consistent with a diagnosis of Multiple Sclerosis. Repeat MRI brain 07/2015 and 02/2018 stable. He had injection site reactions to Copaxone, affecting compliance, and continues to await starting Gilenya. He has had initial eye exam and will need follow-up 3-4 months after starting Gilenya. He was instructed to call our office in a month if he has not heard back about Gilenya first dose observation instructions. He will follow-up in 3 months and knows to call for any changes.  Thank you for allowing me to participate in his care.  Please do not hesitate to call for any questions or concerns.  The duration of this appointment visit was 15 minutes of face-to-face time with the patient.  Greater than 50% of this time was spent in counseling, explanation of diagnosis, planning  of further management, and coordination of care.   Patrcia Dolly, M.D.   CC: Dr. Tenny Craw

## 2018-05-15 NOTE — Patient Instructions (Signed)
1. Proceed with starting Gilenya as planned 2. Contact our office in a month if you are still having issues getting the medication 3. Follow-up in 3 months, call for any changes

## 2018-08-01 ENCOUNTER — Telehealth: Payer: Self-pay | Admitting: Neurology

## 2018-08-01 NOTE — Telephone Encounter (Signed)
Patient called to let Dr. Karel Jarvis know that he has not heard from Gilenya. Please Call. Thanks

## 2018-08-18 ENCOUNTER — Encounter: Payer: Self-pay | Admitting: Neurology

## 2018-08-18 ENCOUNTER — Other Ambulatory Visit: Payer: Self-pay

## 2018-08-18 ENCOUNTER — Ambulatory Visit (INDEPENDENT_AMBULATORY_CARE_PROVIDER_SITE_OTHER): Payer: 59 | Admitting: Neurology

## 2018-08-18 VITALS — BP 120/74 | HR 88 | Ht 77.0 in | Wt 210.0 lb

## 2018-08-18 DIAGNOSIS — G35 Multiple sclerosis: Secondary | ICD-10-CM

## 2018-08-18 MED ORDER — FINGOLIMOD HCL 0.5 MG PO CAPS: 0.5000 mg | ORAL_CAPSULE | Freq: Every day | ORAL | 5 refills | Status: DC

## 2018-08-18 NOTE — Patient Instructions (Signed)
Let's get medication going, if we don't hear from Gilenya in a week, we will do a different medication. Follow-up in 4 months, call for any changes. Have a good trip!

## 2018-08-18 NOTE — Progress Notes (Signed)
NEUROLOGY FOLLOW UP OFFICE NOTE  Derrie Caminiti [id number]  DOB: [birth year]  HISTORY OF PRESENT ILLNESS: I had the pleasure of seeing Lamondre Harvin in follow-up in the neurology clinic on 08/18/2018.  The patient was last seen 3 months ago for multiple sclerosis. It is unclear why he continues to have difficulties obtaining Gilenya, our office will follow-up on this. He had injection site reactions of bleeding and muscle twitching with Copaxone. He has had his eye exam and has been awaiting callback for first dose observation instructions. Since his last visit, he denies any symptoms concerning for MS flare. He has brief moments of weakness sometimes but nothing consistent. He has noticed equilibrium issues, especially if standing for prolonged periods, he would lean a little to one side and be off balanced. No falls. He denies any focal numbness/tingling/weakness, negative Lhermitte sign. No vision loss or vision changes. No bowel/bladder dysfunction. He is planning to vacation in [place] next month.  HPI: This is a very pleasant 38 yo LH man with no significant past medical history who presented for evaluation of possible Lhermitte's sign and numbness last 02/2014. Symptoms started around May 2015 when he had numbness in the fingers of his right hand. After a week, it spread to his entire hand, then a week later, he noticed the numbness in his left hand. The episode lasted 2 weeks. Around June 2015, he noticed that every time he would flex his neck, he feels a sensation of vibration radiating from his neck down his back all the way to his feet. He denies any neck pain, but describes a vibrating sensation in his neck and in his thighs Symptoms resolve when he lifts his head back to neutral position. He has occasional numbness his toes. He would get numbness in both legs when bending forward while sitting, which he attributed to positional changes. No facial involvement. He denies similar symptoms in the  past. He denies any vision changes/loss of vision in the past. He is very active with basketball, weights, and cardio. He noticed that recently when he does push-ups, his bones feel a little more sore.   His MRI brain with and without contrast done July 2015 had shown extensive periventricular and scattered subcortical white matter changes bilaterally. There is asymmetric white matter disease adjacent to the atrium of the left lateral ventricle. There is extensive involvement of the colossoseptal margin. There is some diffusion signal abnormality within the area involving the right coronal radiata. A 7 x 6 x 2 mm focus of enhancement is associated with a white matter lesion in the anterior left frontal lobe. There are 2 punctate areas of enhancement associated with the area of signal abnormality in the right coronal radiata. MRI C-spine and T-spine normal, no abnormal signal or enhancement seen. He had a lumbar puncture which showed >5 oligoclonal bands. All other studies negative as noted previously. He did have a post-LP headache for several days but did not proceed with blood patch. Vitamin D level 40 I personally reviewed repeat MRI brain with and without contrast done 07/2015 was overall stable, showing extensive white matter changes bilaterally, 45mm lesion in the left midbrain, asymmetric left-sided T2 changes in the cerebellum. There is extensive involvement of the colossal septal margin. The 59mm focal area of enhancement in the left frontal operculum is stable. No new lesions.  I personally reviewed MRI brain with and without contrast done 02/23/18, no new lesions compared to 2016 imaging, there were multiple periventricular greater than subcortical white  matter lesions again seen, no abnormal enhancement. The largest black hole is in the left frontal subcortical white matter.   PAST MEDICAL HISTORY: Past Medical History:  Diagnosis Date  . MS (multiple sclerosis) (HCC)     MEDICATIONS: No  current outpatient medications on file prior to visit.   No current facility-administered medications on file prior to visit.     ALLERGIES: Allergies  Allergen Reactions  . Mucinex [Guaifenesin Er] Other (See Comments)    Projectile vomiting    FAMILY HISTORY: Family History  Problem Relation Age of Onset  . Hypertension Father   . Diabetes Paternal Aunt     SOCIAL HISTORY: Social History   Socioeconomic History  . Marital status: Married    Spouse name: Not on file  . Number of children: Not on file  . Years of education: Not on file  . Highest education level: Not on file  Occupational History  . Not on file  Social Needs  . Financial resource strain: Not on file  . Food insecurity:    Worry: Not on file    Inability: Not on file  . Transportation needs:    Medical: Not on file    Non-medical: Not on file  Tobacco Use  . Smoking status: Current Every Day Smoker    Types: Cigarettes  . Smokeless tobacco: Never Used  Substance and Sexual Activity  . Alcohol use: Yes    Alcohol/week: 0.0 standard drinks    Comment: social  . Drug use: No  . Sexual activity: Not on file  Lifestyle  . Physical activity:    Days per week: Not on file    Minutes per session: Not on file  . Stress: Not on file  Relationships  . Social connections:    Talks on phone: Not on file    Gets together: Not on file    Attends religious service: Not on file    Active member of club or organization: Not on file    Attends meetings of clubs or organizations: Not on file    Relationship status: Not on file  . Intimate partner violence:    Fear of current or ex partner: Not on file    Emotionally abused: Not on file    Physically abused: Not on file    Forced sexual activity: Not on file  Other Topics Concern  . Not on file  Social History Narrative   Lives with wife and children in a two story home.  Works for [employer].    REVIEW OF SYSTEMS: Constitutional: No fevers,  chills, or sweats, generalized fatigue,no change in appetite Eyes: No visual changes, double vision, eye pain Ear, nose and throat: No hearing loss, ear pain, nasal congestion, sore throat Cardiovascular: No chest pain, palpitations Respiratory:  No shortness of breath at rest or with exertion, wheezes GastrointestinaI: No nausea, vomiting, diarrhea, abdominal pain, fecal incontinence Genitourinary:  No dysuria, urinary retention or frequency Musculoskeletal:  No neck pain, back pain Integumentary: No rash, pruritus, skin lesions Neurological: as above Psychiatric: No depression, insomnia, anxiety Endocrine: No palpitations, fatigue, diaphoresis, mood swings, change in appetite, change in weight, increased thirst Hematologic/Lymphatic:  No anemia, purpura, petechiae. Allergic/Immunologic: no itchy/runny eyes, nasal congestion, recent allergic reactions, rashes  PHYSICAL EXAM: Vitals:   08/18/18 1147  BP: 120/74  Pulse: 88  SpO2: 99%   General: No acute distress Head:  Normocephalic/atraumatic Neck: supple, no paraspinal tenderness, full range of motion, negative Lhermitte sign Heart:  Regular rate  and rhythm Lungs:  Clear to auscultation bilaterally Back: No paraspinal tenderness Skin/Extremities: No rash, no edema Neurological Exam: alert and oriented to person, place, and time. No aphasia or dysarthria. Fund of knowledge is appropriate.  Recent and remote memory are intact.  Attention and concentration are normal.    Able to name objects and repeat phrases. Cranial nerves: Pupils equal, round, reactive to light. Extraocular movements intact with no nystagmus. Visual fields full. Facial sensation intact. No facial asymmetry. Tongue, uvula, palate midline.  Motor: Bulk and tone normal, muscle strength 5/5 throughout with no pronator drift.  Sensation intact to light touch, cold. No extinction to double simultaneous stimulation.  Deep tendon reflexes +1 on both UE, +2 both LE, toes  downgoing.  Finger to nose testing intact.  Gait narrow-based and steady, able to tandem walk adequately.  Romberg negative. Exam unchanged from prior.  IMPRESSION: This is a very pleasant 38 yo LH man with no significant past medication history, who initially presented with paresthesias that lasted for 2 weeks that started in May 2015, then had symptoms suggestive of Lhermitte's sign in June 2015. His MRI brain in July 2015 showed extensive white matter changes bilaterally and in the callososeptal region, with small foci of abnormal enhancement in the left frontal and right corona radiata. CSF showed >5 oligoclonal bands. Findings consistent with a diagnosis of Multiple Sclerosis. Repeat MRI brain 07/2015 and 02/2018 stable. He had injection site reactions to Copaxone, affecting compliance, it is unclear why we continue to have issues starting the Gilenya. [facility] rep contacted today. If we continue to have difficulties, we will plan to start a different oral DMD. He will follow-up in 4 months and knows to call for any changes.  Thank you for allowing me to participate in his care.  Please do not hesitate to call for any questions or concerns.  The duration of this appointment visit was 15 minutes of face-to-face time with the patient.  Greater than 50% of this time was spent in counseling, explanation of diagnosis, planning of further management, and coordination of care.   Patrcia Dolly, M.D.   CC: Dr. Tenny Craw

## 2018-08-21 NOTE — Progress Notes (Signed)
Rcvd fax from [employer], Benefits Investigation Report.  PA is required, and to call [phone] opt#2.  Called and spoke with Cherie Dark R. She stated they must have 3 identifiers match for the Pt to initiate a PA. The Pt's address, zip code and phone number do not match the information I provided her.   I called and LMOVM of home number for  Pt to call back about Gilenya PA. I attempted to reach Pt also on mobil #, call could not be completed at this time.

## 2018-08-21 NOTE — Progress Notes (Signed)
Received notice from [facility] that they are not included in the pt's network, however [facility] is. [facility] will forward referral for [facility] to [facility] on our behalf.

## 2018-08-25 ENCOUNTER — Telehealth: Payer: Self-pay | Admitting: Neurology

## 2018-08-25 NOTE — Telephone Encounter (Signed)
Vikki Ports called from [facility] and stated that they have been unable to reach the patient. They have made several attempts to reach the patient as well as mailing a letter to him. Thanks

## 2018-08-25 NOTE — Telephone Encounter (Signed)
Noted  

## 2018-08-28 ENCOUNTER — Telehealth: Payer: Self-pay

## 2018-08-28 MED ORDER — FINGOLIMOD HCL 0.5 MG PO CAPS: 0.5000 mg | ORAL_CAPSULE | Freq: Every day | ORAL | 5 refills | Status: DC

## 2018-08-28 NOTE — Telephone Encounter (Signed)
Received call from Onalee Hua with [facility] that they need a paper copy of Rx faxed over to their specialty pharmacy.  Rx placed in system and printed for signature.  Will fax to [phone] once signed.

## 2018-09-22 ENCOUNTER — Telehealth: Payer: Self-pay

## 2018-09-22 NOTE — Telephone Encounter (Signed)
Received after hours message from Dianna with [facility] to advise that they have attempted to contact pt numerous times and have been unsuccessful.  Note states that they will close case if pt cannot be reached by March 6.  CB#[phone]

## 2018-12-10 ENCOUNTER — Other Ambulatory Visit: Payer: Self-pay

## 2018-12-10 ENCOUNTER — Telehealth (INDEPENDENT_AMBULATORY_CARE_PROVIDER_SITE_OTHER): Payer: 59 | Admitting: Neurology

## 2018-12-10 VITALS — Ht 77.0 in | Wt 209.0 lb

## 2018-12-10 DIAGNOSIS — G35 Multiple sclerosis: Secondary | ICD-10-CM | POA: Diagnosis not present

## 2018-12-10 NOTE — Progress Notes (Signed)
Virtual Visit via Video Note The purpose of this virtual visit is to provide medical care while limiting exposure to the novel coronavirus.    Consent was obtained for video visit:  Yes.   Answered questions that patient had about telehealth interaction:  Yes.   I discussed the limitations, risks, security and privacy concerns of performing an evaluation and management service by telemedicine. I also discussed with the patient that there may be a patient responsible charge related to this service. The patient expressed understanding and agreed to proceed.  Pt location: Home Physician Location: office Name of referring provider:  Daisy Floro, MD I connected with Laney Pastor at patients initiation/request on 12/10/2018 at  2:30 PM EDT by video enabled telemedicine application and verified that I am speaking with the correct person using two identifiers. Pt MRN:  [id number] Pt DOB:  [birth year] Video Participants:  Laney Pastor   History of Present Illness: The patient was last seen in January 2020 for multiple sclerosis. On his last visit, we had a long discussion about need for being on a disease modifying agent for his MS, but he still has not started Gilenya. Our office had received calls from [facility] that he had been contacted numerous times with no response and that case would be closed at a certain point. He states he got furloughed and has been unable to do much since that time. Thankfully he has been doing well with no relapses, he denies any focal numbness/tingling/weakness, Lhermitte sign, vision changes, bowel/bladder dysfunction. Energy level is good. Sleep is good. He denies any headaches, dizziness, no falls.   HPI: This is a very pleasant 38 yo LH man with no significant past medical history who presented for evaluation of possible Lhermitte's sign and numbness last 02/2014. Symptoms started around May 2015 when he had numbness in the fingers of his right hand.  After a week, it spread to his entire hand, then a week later, he noticed the numbness in his left hand. The episode lasted 2 weeks. Around June 2015, he noticed that every time he would flex his neck, he feels a sensation of vibration radiating from his neck down his back all the way to his feet. He denies any neck pain, but describes a vibrating sensation in his neck and in his thighs Symptoms resolve when he lifts his head back to neutral position. He has occasional numbness his toes. He would get numbness in both legs when bending forward while sitting, which he attributed to positional changes. No facial involvement. He denies similar symptoms in the past. He denies any vision changes/loss of vision in the past. He is very active with basketball, weights, and cardio. He noticed that recently when he does push-ups, his bones feel a little more sore.   His MRI brain with and without contrast done July 2015 had shown extensive periventricular and scattered subcortical white matter changes bilaterally. There is asymmetric white matter disease adjacent to the atrium of the left lateral ventricle. There is extensive involvement of the colossoseptal margin. There is some diffusion signal abnormality within the area involving the right coronal radiata. A 7 x 6 x 2 mm focus of enhancement is associated with a white matter lesion in the anterior left frontal lobe. There are 2 punctate areas of enhancement associated with the area of signal abnormality in the right coronal radiata. MRI C-spine and T-spine normal, no abnormal signal or enhancement seen. He had a lumbar puncture which showed >5  oligoclonal bands. All other studies negative as noted previously. He did have a post-LP headache for several days but did not proceed with blood patch. Vitamin D level 40 I personally reviewed repeat MRI brain with and without contrast done 07/2015 was overall stable, showing extensive white matter changes bilaterally, 26mm  lesion in the left midbrain, asymmetric left-sided T2 changes in the cerebellum. There is extensive involvement of the colossal septal margin. The 38mm focal area of enhancement in the left frontal operculum is stable. No new lesions.  I personally reviewed MRI brain with and without contrast done 02/23/18, no new lesions compared to 2016 imaging, there were multiple periventricular greater than subcortical white matter lesions again seen, no abnormal enhancement. The largest black hole is in the left frontal subcortical white matter.     Observations/Objective:   Vitals:   12/10/18 1349  Weight: 209 lb (94.8 kg)  Height: 6\' 5"  (1.956 m)   GEN:  The patient appears stated age and is in NAD. Patient is awake, alert, oriented x 3. No aphasia or dysarthria. Intact fluency and comprehension. Remote and recent memory intact. Able to name and repeat. Cranial nerves: Extraocular movements intact with no nystagmus. No facial asymmetry. Motor: moves all extremities symmetrically, at least anti-gravity x 4.   Assessment and Plan:   This is a very pleasant 38 yo LH man with no significant past medication history, who initially presented with paresthesias that lasted for 2 weeks that started in May 2015, then had symptoms suggestive of Lhermitte's sign in June 2015. His MRI brain in July 2015 showed extensive white matter changes bilaterally and in the callososeptal region, with small foci of abnormal enhancement in the left frontal and right corona radiata. CSF showed >5 oligoclonal bands. Findings consistent with a diagnosis of Multiple Sclerosis. Repeat MRI brain 07/2015 and 02/2018 stable. He had injection site reactions to Copaxone, affecting compliance, we have had multiple discussions in the past about the need for being on a disease modifying agent however he still has not started Gilenya. Contact information for [facility] given to patient, prescription has been previously sent to their specialty pharmacy. Our  office will follow-up with him in 3-4 weeks to ensure this has been done. He will follow-up in 6 months and knows to call for any changes.   Follow Up Instructions:   -I discussed the assessment and treatment plan with the patient. The patient was provided an opportunity to ask questions and all were answered. The patient agreed with the plan and demonstrated an understanding of the instructions.   The patient was advised to call back or seek an in-person evaluation if the symptoms worsen or if the condition fails to improve as anticipated.   Van Clines, MD

## 2018-12-17 ENCOUNTER — Ambulatory Visit: Payer: 59 | Admitting: Neurology

## 2019-03-12 ENCOUNTER — Other Ambulatory Visit: Payer: Self-pay

## 2019-04-03 ENCOUNTER — Telehealth: Payer: Self-pay | Admitting: Neurology

## 2019-04-03 NOTE — Telephone Encounter (Signed)
Pt needs a completed a start form faxed to [phone] at [facility]  Phone number is [phone]

## 2019-04-06 NOTE — Telephone Encounter (Signed)
Forms faxed today along with pt demograhics, ins, and OV notes

## 2019-04-27 ENCOUNTER — Telehealth: Payer: Self-pay | Admitting: Neurology

## 2019-04-27 NOTE — Telephone Encounter (Signed)
Travis Castillo from [facility]- pt therapy; unable to reach pt recently. Patient has been unresponsive to complete info that is needed for therapy. Unable to reach for enrollment. Just FYI. Thanks!

## 2019-04-29 NOTE — Telephone Encounter (Signed)
Called pt wife provide new pt phone number [phone], wife [phone]. Notified Ella([facility]) update pt phone number.

## 2019-04-29 NOTE — Telephone Encounter (Signed)
Can you pls call patient and let him know this information that they have been trying to contact him. He is sometimes hard to reach, if cannot get a hold of him, pls call wife listed on DPR. Thanks

## 2019-07-08 ENCOUNTER — Telehealth: Payer: Self-pay

## 2019-07-08 NOTE — Telephone Encounter (Signed)
Patient needs to sign [facility]ya starter form  He works during the office hours so form will be mailed to patient and his wife will drop it off or mail back.

## 2019-07-13 ENCOUNTER — Encounter: Payer: Self-pay | Admitting: Neurology

## 2019-07-17 ENCOUNTER — Telehealth: Payer: Self-pay

## 2019-07-17 NOTE — Telephone Encounter (Signed)
Spoke with representative Philis Nettle. They have tried multiple times to contact pt by phone with no response. Our office has done the same. The representative wanted to know if she should close the case. Instructed her that it would be fine since pt has been unreachable. She states that the Gilenya can easily be restarted. Contact number [phone]

## 2019-07-21 ENCOUNTER — Encounter: Payer: Self-pay | Admitting: Neurology

## 2019-07-22 ENCOUNTER — Other Ambulatory Visit: Payer: Self-pay

## 2019-07-22 ENCOUNTER — Telehealth (INDEPENDENT_AMBULATORY_CARE_PROVIDER_SITE_OTHER): Payer: Self-pay | Admitting: Neurology

## 2019-07-22 VITALS — Ht 77.0 in | Wt 204.0 lb

## 2019-07-22 DIAGNOSIS — G35 Multiple sclerosis: Secondary | ICD-10-CM

## 2019-07-22 NOTE — Progress Notes (Signed)
Virtual Visit via Video Note The purpose of this virtual visit is to provide medical care while limiting exposure to the novel coronavirus.    Consent was obtained for video visit:  Yes.   Answered questions that patient had about telehealth interaction:  Yes.   I discussed the limitations, risks, security and privacy concerns of performing an evaluation and management service by telemedicine. I also discussed with the patient that there may be a patient responsible charge related to this service. The patient expressed understanding and agreed to proceed.  Pt location: Home Physician Location: office Name of referring provider:  Lawerance Cruel, MD I connected with Travis Castillo at patients initiation/request on 07/22/2019 at  8:30 AM EST by video enabled telemedicine application and verified that I am speaking with the correct person using two identifiers. Pt MRN:  [id number] Pt DOB:  Jun 17, 1981 Video Participants:  Travis Castillo   History of Present Illness: The patient was seen as a virtual video visit on 07/22/2019. He was last seen 7 months ago for multiple sclerosis. He reports doing pretty good, he denies any symptoms suggestive of MS flare, no vision loss, paresthesias, weakness. He had initially presented with Lhermitte's sign, he denies any recurrence. He occasionally has brief 2-second spells where he would feel unbalanced while he has been standing and not moving, no falls. No headaches. He has noticed urinary frequency, going to the bathroom every 1.5 hours. He states he also drinks a lot of water. He has not yet started Gilenya, we have had multiple extended discussions in the past about the need for disease modifying treatment with MS, he expressed hesitation but is willing and has signed the [facility] form mailed to them, his wife will mail it out soon. He is in good spirits. His last MRI done 02/2018 did not show any new lesions compared to 2016 imaging, with multiple  periventricular greater than subcortical white matter lesions again seen, no abnormal enhancement. The largest black hole is in the left frontal subcortical white matter.   HPI: This is a very pleasant 38 yo LH man with no significant past medical history who presented for evaluation of possible Lhermitte's sign and numbness last 02/2014. Symptoms started around May 2015 when he had numbness in the fingers of his right hand. After a week, it spread to his entire hand, then a week later, he noticed the numbness in his left hand. The episode lasted 2 weeks. Around June 2015, he noticed that every time he would flex his neck, he feels a sensation of vibration radiating from his neck down his back all the way to his feet. He denies any neck pain, but describes a vibrating sensation in his neck and in his thighs Symptoms resolve when he lifts his head back to neutral position. He has occasional numbness his toes. He would get numbness in both legs when bending forward while sitting, which he attributed to positional changes. No facial involvement. He denies similar symptoms in the past. He denies any vision changes/loss of vision in the past. He is very active with basketball, weights, and cardio. He noticed that recently when he does push-ups, his bones feel a little more sore.   His MRI brain with and without contrast done July 2015 had shown extensive periventricular and scattered subcortical white matter changes bilaterally. There is asymmetric white matter disease adjacent to the atrium of the left lateral ventricle. There is extensive involvement of the colossoseptal margin. There is some diffusion signal abnormality  within the area involving the right coronal radiata. A 7 x 6 x 2 mm focus of enhancement is associated with a white matter lesion in the anterior left frontal lobe. There are 2 punctate areas of enhancement associated with the area of signal abnormality in the right coronal radiata. MRI C-spine  and T-spine normal, no abnormal signal or enhancement seen. He had a lumbar puncture which showed >5 oligoclonal bands. All other studies negative as noted previously. He did have a post-LP headache for several days but did not proceed with blood patch. Vitamin D level 40 I personally reviewed repeat MRI brain with and without contrast done 07/2015 was overall stable, showing extensive white matter changes bilaterally, 37mm lesion in the left midbrain, asymmetric left-sided T2 changes in the cerebellum. There is extensive involvement of the colossal septal margin. The 37mm focal area of enhancement in the left frontal operculum is stable. No new lesions.  I personally reviewed MRI brain with and without contrast done 7/37/19, no new lesions compared to 2016 imaging, there were multiple periventricular greater than subcortical white matter lesions again seen, no abnormal enhancement. The largest black hole is in the left frontal subcortical white matter.     Observations/Objective:   Vitals:   07/21/19 1622  Weight: 204 lb (92.5 kg)  Height: 6\' 5"  (1.956 m)   GEN:  The patient appears stated age and is in NAD. Patient is awake, alert, oriented x 3. No aphasia or dysarthria. Intact fluency and comprehension. Remote and recent memory intact. Able to name and repeat. Cranial nerves: Extraocular movements intact with no nystagmus. No facial asymmetry. Motor: moves all extremities symmetrically, at least anti-gravity x 4.   Assessment and Plan:   This is a very pleasant 38 yo LH man with no significant past medication history, who initially presented with paresthesias that lasted for 2 weeks that started in May 2015, then had symptoms suggestive of Lhermitte's sign in June 2015. His MRI brain in July 2015 showed extensive white matter changes bilaterally and in the callososeptal region, with small foci of abnormal enhancement in the left frontal and right corona radiata. CSF showed >5 oligoclonal bands.  Findings consistent with a diagnosis of Multiple Sclerosis. Repeat MRI brain 07/2015 and 02/2018 stable. He had injection site reactions to Copaxone, affecting compliance, he has opted to start Gilenya. Gilenya program has contacted him several times to initiate medication with no response, he states he is now ready to proceed. He will be scheduled for follow-up annual MRI brain with and without contrast. He will follow-up in 6 months and knows to call for any changes.   Follow Up Instructions:   -I discussed the assessment and treatment plan with the patient. The patient was provided an opportunity to ask questions and all were answered. The patient agreed with the plan and demonstrated an understanding of the instructions.   The patient was advised to call back or seek an in-person evaluation if the symptoms worsen or if the condition fails to improve as anticipated.   03/2018, MD

## 2019-10-15 ENCOUNTER — Ambulatory Visit: Payer: Self-pay

## 2020-01-25 ENCOUNTER — Ambulatory Visit: Payer: 59 | Admitting: Neurology

## 2020-11-09 ENCOUNTER — Encounter: Payer: Self-pay | Admitting: Neurology

## 2020-11-09 ENCOUNTER — Ambulatory Visit (INDEPENDENT_AMBULATORY_CARE_PROVIDER_SITE_OTHER): Payer: Self-pay | Admitting: Neurology

## 2020-11-09 ENCOUNTER — Other Ambulatory Visit: Payer: Self-pay

## 2020-11-09 VITALS — BP 136/90 | HR 75 | Ht 77.0 in | Wt 208.6 lb

## 2020-11-09 DIAGNOSIS — G35 Multiple sclerosis: Secondary | ICD-10-CM

## 2020-11-09 NOTE — Patient Instructions (Signed)
1. Schedule MRI brain with and without contrast, MRI cervical spine with and without contrast  2. Start Gilenya process  3. Follow-up in 4 months, call for any changes

## 2020-11-09 NOTE — Progress Notes (Signed)
NEUROLOGY FOLLOW UP OFFICE NOTE  Travis Castillo [id number] 1980/12/16  HISTORY OF PRESENT ILLNESS: I had the pleasure of seeing Travis Castillo in follow-up in the neurology clinic on 11/09/2020.  The patient was last seen on in December 2020 for multiple sclerosis. He had been stable for several years and for unclear reasons has still not started Gilenya as recommended, until last weekend when he had an episode of significant body pain from his throat down to his toes. This lasted for 2-3 days. His left arm still feels weaker. He has noticed his vision is starting to change, it is not clear as before (both eyes). No bowel/bladder dysfunction. No focal numbness/tingling. Lhermitte sign has not recurred. No falls. There are times he does not sleep well. Mood is good.    HPI: This is a very pleasant 40 yo LH man with no significant past medical history who presented for evaluation of possible Lhermitte's sign and numbness last 02/2014. Symptoms started around May 2015 when he had numbness in the fingers of his right hand. After a week, it spread to his entire hand, then a week later, he noticed the numbness in his left hand. The episode lasted 2 weeks. Around June 2015, he noticed that every time he would flex his neck, he feels a sensation of vibration radiating from his neck down his back all the way to his feet. He denies any neck pain, but describes a vibrating sensation in his neck and in his thighs Symptoms resolve when he lifts his head back to neutral position. He has occasional numbness his toes. He would get numbness in both legs when bending forward while sitting, which he attributed to positional changes. No facial involvement. He denies similar symptoms in the past. He denies any vision changes/loss of vision in the past. He is very active with basketball, weights, and cardio. He noticed that recently when he does push-ups, his bones feel a little more sore.   His MRI brain with and without  contrast done July 2015 had shown extensive periventricular and scattered subcortical white matter changes bilaterally. There is asymmetric white matter disease adjacent to the atrium of the left lateral ventricle. There is extensive involvement of the colossoseptal margin. There is some diffusion signal abnormality within the area involving the right coronal radiata. A 7 x 6 x 2 mm focus of enhancement is associated with a white matter lesion in the anterior left frontal lobe. There are 2 punctate areas of enhancement associated with the area of signal abnormality in the right coronal radiata. MRI C-spine and T-spine normal, no abnormal signal or enhancement seen. He had a lumbar puncture which showed >5 oligoclonal bands. All other studies negative as noted previously. He did have a post-LP headache for several days but did not proceed with blood patch. Vitamin D level 40 I personally reviewed repeat MRI brain with and without contrast done 07/2015 was overall stable, showing extensive white matter changes bilaterally, 48mm lesion in the left midbrain, asymmetric left-sided T2 changes in the cerebellum. There is extensive involvement of the colossal septal margin. The 23mm focal area of enhancement in the left frontal operculum is stable. No new lesions.  I personally reviewed MRI brain with and without contrast done 02/23/18, no new lesions compared to 2016 imaging, there were multiple periventricular greater than subcortical white matter lesions again seen, no abnormal enhancement. The largest black hole is in the left frontal subcortical white matter.    PAST MEDICAL HISTORY: Past Medical History:  Diagnosis Date  . MS (multiple sclerosis) (HCC)     MEDICATIONS: No current outpatient medications on file prior to visit.   No current facility-administered medications on file prior to visit.    ALLERGIES: Allergies  Allergen Reactions  . Mucinex [Guaifenesin Er] Other (See Comments)     Projectile vomiting    FAMILY HISTORY: Family History  Problem Relation Age of Onset  . Hypertension Father   . Diabetes Paternal Aunt     SOCIAL HISTORY: Social History   Socioeconomic History  . Marital status: Married    Spouse name: Not on file  . Number of children: Not on file  . Years of education: Not on file  . Highest education level: Not on file  Occupational History  . Not on file  Tobacco Use  . Smoking status: Current Every Day Smoker    Types: Cigarettes  . Smokeless tobacco: Never Used  Vaping Use  . Vaping Use: Never used  Substance and Sexual Activity  . Alcohol use: Yes    Alcohol/week: 0.0 standard drinks    Comment: social  . Drug use: No  . Sexual activity: Yes    Partners: Female  Other Topics Concern  . Not on file  Social History Narrative   Lives with wife and children in a two story home.     Works for [employer].   Right handed         Social Determinants of Health   Financial Resource Strain: Not on file  Food Insecurity: Not on file  Transportation Needs: Not on file  Physical Activity: Not on file  Stress: Not on file  Social Connections: Not on file  Intimate Partner Violence: Not on file     PHYSICAL EXAM: Vitals:   11/09/20 1455  BP: 136/90  Pulse: 75  SpO2: 99%   General: No acute distress Head:  Normocephalic/atraumatic Skin/Extremities: No rash, no edema Neurological Exam: alert and oriented to person, place, and time. No aphasia or dysarthria. Fund of knowledge is appropriate.  Recent and remote memory are intact.  Attention and concentration are normal.   Cranial nerves: Pupils equal, round. Extraocular movements intact with no nystagmus. Visual fields full.  No facial asymmetry.  Motor: Bulk and tone normal, muscle strength 5/5 throughout except for mild weakness in left finger flexors. Sensation intact to cold, pin, vibration sense. Reflexes +2 throughout. Toes down  Finger to nose testing intact.  Gait  narrow-based and steady, able to tandem walk adequately.  Romberg negative.   IMPRESSION: This is a very pleasant 40 yo LH man with no significant past medication history, who initially presented with paresthesias that lasted for 2 weeks that started in May 2015, then had symptoms suggestive of Lhermitte's sign in June 2015. His MRI brain in July 2015 showed extensive white matter changes bilaterally and in the callososeptal region, with small foci of abnormal enhancement in the left frontal and right corona radiata. CSF showed >5 oligoclonal bands. Findings consistent with a diagnosis of Multiple Sclerosis. Serial imaging showed stability, last MRI was in 2019. He reports a 3 day episode of diffuse body pain with residual left arm weakness that started last weekend. MRI brain and cervical spine with and without contrast will be ordered to assess for new lesions. It is unclear why he still has not started Gilenya, he again says he is ready to proceed, we will need to restart the process (EKG, Ophtho eval, bloodwork). Follow-up in 4 months, call for any  changes.    Thank you for allowing me to participate in his care.  Please do not hesitate to call for any questions or concerns.   Patrcia Dolly, M.D.   CC: Dr. Tenny Craw

## 2020-11-20 ENCOUNTER — Other Ambulatory Visit: Payer: Self-pay

## 2020-11-20 ENCOUNTER — Ambulatory Visit
Admission: RE | Admit: 2020-11-20 | Discharge: 2020-11-20 | Disposition: A | Discharge status: Home / Self Care | Payer: 59 | Source: Ambulatory Visit | Attending: Neurology | Admitting: Neurology

## 2020-11-20 DIAGNOSIS — G35 Multiple sclerosis: Secondary | ICD-10-CM

## 2020-11-20 MED ORDER — GADOBENATE DIMEGLUMINE 529 MG/ML IV SOLN
20.0000 mL | Freq: Once | INTRAVENOUS | Status: AC | PRN
  Administered 2020-11-20: 20 mL via INTRAVENOUS

## 2020-11-21 ENCOUNTER — Other Ambulatory Visit: Payer: Self-pay | Admitting: Neurology

## 2020-11-21 ENCOUNTER — Telehealth: Payer: Self-pay

## 2020-11-21 NOTE — Telephone Encounter (Signed)
-----   Message from Van Clines, MD sent at 11/20/2020 11:48 PM EDT ----- Pls let him know the brain MRI has shown changes compared to his last scan. One of the changes is newer, I would recommend 3-day IV Solumedrom 1000mg  at infusion center. Pls order, and pls encourage him to proceed with Gilenya as discussed on prior visits. Thanks

## 2020-11-21 NOTE — Telephone Encounter (Signed)
Order for Solu-Medrol 1000mg  daily for 3 days entered. Regarding change to Ocrevus, he should follow up and discuss with Dr. 

## 2020-11-21 NOTE — Telephone Encounter (Signed)
Pt called and informed that MRI has shown changes compared to his last scan. One of the changes is newer, Dr Karel Jarvis would recommend 3-day IV Solumedrom 1000mg  at infusion center. Will place order and treatment plan in Epic Pt encourage to proceed with Gilenya as discussed on prior visit. Pt is also asking about if ocrevus would be a good option for him?

## 2020-11-22 ENCOUNTER — Telehealth: Payer: Self-pay | Admitting: Pharmacy Technician

## 2020-11-22 NOTE — Telephone Encounter (Signed)
Auth Submission: Payer: [employer] Medication & CPT/J Code(s) submitted: Solumedrol (Methylprednisolone) J2930 Route of submission (phone, fax, portal): PHONE [phone] Units/visits requested: 3 Reference number: [id number]  Will update once we receive a response.

## 2020-11-24 ENCOUNTER — Telehealth: Payer: Self-pay | Admitting: Neurology

## 2020-11-24 NOTE — Telephone Encounter (Signed)
Received notification from [employer] regarding a prior authorization for SOLU-MEDROL. Authorization has been APPROVED from 11/22/20 to 12/10/20.   Authorization # [id number] Phone # [phone]

## 2020-11-24 NOTE — Telephone Encounter (Signed)
Patient called in stating he has decided he does want to go ahead and get a handicap tag for his car. Patient is aware Dr. Karel Jarvis is currently out of the office.

## 2020-11-24 NOTE — Telephone Encounter (Signed)
Called patient and informed him that I have printed, filled out, and placed the parking placard request on Dr. Rosalyn Gess desk for signature once she returns. Patient is aware that once she looks it over we will give him a call back.

## 2020-11-28 ENCOUNTER — Telehealth: Payer: Self-pay

## 2020-11-28 ENCOUNTER — Ambulatory Visit: Payer: 59

## 2020-11-29 ENCOUNTER — Telehealth: Payer: Self-pay

## 2020-12-01 ENCOUNTER — Ambulatory Visit (INDEPENDENT_AMBULATORY_CARE_PROVIDER_SITE_OTHER): Payer: 59 | Admitting: *Deleted

## 2020-12-01 ENCOUNTER — Other Ambulatory Visit: Payer: Self-pay

## 2020-12-01 VITALS — BP 136/88 | HR 89 | Temp 97.6°F | Resp 16

## 2020-12-01 DIAGNOSIS — G35 Multiple sclerosis: Secondary | ICD-10-CM

## 2020-12-01 MED ORDER — SODIUM CHLORIDE 0.9 % IV SOLN
1000.0000 mg | Freq: Once | INTRAVENOUS | Status: AC
Start: 2020-12-01 — End: 2020-12-01
  Administered 2020-12-01: 1000 mg via INTRAVENOUS
  Filled 2020-12-01: qty 8

## 2020-12-01 NOTE — Progress Notes (Signed)
Diagnosis: Multiple Sclerosis  Provider:  Chilton Greathouse, MD  Procedure: Infusion  IV Type: Peripheral, IV Location: L Forearm  Solumedrol (Methylprednisolone), Dose: 1000 mg  Infusion Start Time: 1503  Infusion Stop Time: 1603  Post Infusion IV Care: Observation period completed  Discharge: Condition: Good, Destination: Home . AVS provided to patient.   Performed by:  Robb Matar, RN

## 2020-12-05 ENCOUNTER — Ambulatory Visit: Payer: 59

## 2020-12-05 ENCOUNTER — Telehealth: Payer: Self-pay

## 2020-12-06 ENCOUNTER — Other Ambulatory Visit: Payer: Self-pay

## 2020-12-06 ENCOUNTER — Ambulatory Visit (INDEPENDENT_AMBULATORY_CARE_PROVIDER_SITE_OTHER): Payer: 59

## 2020-12-06 VITALS — BP 160/100 | HR 88 | Temp 98.1°F | Resp 18

## 2020-12-06 DIAGNOSIS — G35 Multiple sclerosis: Secondary | ICD-10-CM

## 2020-12-06 MED ORDER — SODIUM CHLORIDE 0.9 % IV SOLN
1000.0000 mg | Freq: Once | INTRAVENOUS | Status: AC
  Administered 2020-12-06: 1000 mg via INTRAVENOUS
  Filled 2020-12-06 (×2): qty 8

## 2020-12-06 NOTE — Progress Notes (Signed)
Diagnosis: Multiple Sclerosis  Provider:  Chilton Greathouse, MD  Procedure: Infusion  IV Type: Peripheral, IV Location: R Forearm  Solumedrol (Methylprednisolone), Dose: 1000  Infusion Start Time: 1224  Infusion Stop Time: 1339  Post Infusion IV Care: Observation period completed and Peripheral IV Discontinued  Discharge: Condition: Good, Destination: Home . AVS provided to patient.   Performed by:  Garnette Czech, RN

## 2020-12-08 ENCOUNTER — Ambulatory Visit: Payer: 59

## 2020-12-08 ENCOUNTER — Telehealth: Payer: Self-pay | Admitting: Neurology

## 2020-12-08 NOTE — Telephone Encounter (Signed)
New message   Pt c/o medication issue:  1. Name of Medication: Gilenya 0.5 mg   2. How are you currently taking this medication (dosage and times per day)? Restarting medication    3. Are you having a reaction (difficulty breathing--STAT)? No   4. What is your medication issue? Checking on prior authorization

## 2020-12-09 ENCOUNTER — Ambulatory Visit: Payer: 59

## 2020-12-09 NOTE — Telephone Encounter (Signed)
PA started in cover my meds

## 2020-12-12 NOTE — Telephone Encounter (Signed)
Cover my meds  Product/Service Not Covered - Plan/Benefit Exclusion.

## 2020-12-13 ENCOUNTER — Telehealth: Payer: Self-pay | Admitting: Neurology

## 2020-12-13 NOTE — Telephone Encounter (Signed)
See other phone note

## 2020-12-13 NOTE — Telephone Encounter (Signed)
Pls let him know the Ocrevus is also not covered by his plan. The oral medication covered by his plan is Dimethyl Fumarate, which is also a good medication, effective for MS also. If he wants to come in to the office to discuss this, I can see him on May 26 at 12nn (pls let front know to schedule if he wants to come then). Otherwise, we can mail him some information about Dimethyl Fumarate for MS and he can let us know if he would like to proceed. Thanks

## 2020-12-13 NOTE — Telephone Encounter (Signed)
Pt is sch for 12-29-20 at 12:00 with Karel Jarvis

## 2020-12-13 NOTE — Telephone Encounter (Signed)
New message   Have questions regarding Gilenya will discuss further when the nurse call back .

## 2020-12-14 ENCOUNTER — Ambulatory Visit: Payer: 59

## 2020-12-29 ENCOUNTER — Ambulatory Visit: Payer: 59 | Admitting: Neurology

## 2020-12-29 ENCOUNTER — Ambulatory Visit (INDEPENDENT_AMBULATORY_CARE_PROVIDER_SITE_OTHER): Payer: 59 | Admitting: Neurology

## 2020-12-29 ENCOUNTER — Encounter: Payer: Self-pay | Admitting: Neurology

## 2020-12-29 ENCOUNTER — Other Ambulatory Visit: Payer: Self-pay

## 2020-12-29 ENCOUNTER — Other Ambulatory Visit (INDEPENDENT_AMBULATORY_CARE_PROVIDER_SITE_OTHER): Payer: 59

## 2020-12-29 VITALS — BP 149/92 | HR 98 | Ht 77.0 in | Wt 210.4 lb

## 2020-12-29 DIAGNOSIS — G35 Multiple sclerosis: Secondary | ICD-10-CM

## 2020-12-29 LAB — CBC WITH DIFFERENTIAL/PLATELET
Basophils Absolute: 0.2 10*3/uL — ABNORMAL HIGH (ref 0.0–0.1)
Basophils Relative: 3.4 % — ABNORMAL HIGH (ref 0.0–3.0)
Eosinophils Absolute: 0.2 10*3/uL (ref 0.0–0.7)
Eosinophils Relative: 2.5 % (ref 0.0–5.0)
HCT: 42.9 % (ref 39.0–52.0)
Hemoglobin: 14.5 g/dL (ref 13.0–17.0)
Lymphocytes Relative: 44.1 % (ref 12.0–46.0)
Lymphs Abs: 3 10*3/uL (ref 0.7–4.0)
MCHC: 33.8 g/dL (ref 30.0–36.0)
MCV: 93 fl (ref 78.0–100.0)
Monocytes Absolute: 0.6 10*3/uL (ref 0.1–1.0)
Monocytes Relative: 8.7 % (ref 3.0–12.0)
Neutro Abs: 2.8 10*3/uL (ref 1.4–7.7)
Neutrophils Relative %: 41.3 % — ABNORMAL LOW (ref 43.0–77.0)
Platelets: 243 10*3/uL (ref 150.0–400.0)
RBC: 4.62 Mil/uL (ref 4.22–5.81)
RDW: 13.8 % (ref 11.5–15.5)
WBC: 6.8 10*3/uL (ref 4.0–10.5)

## 2020-12-29 LAB — VITAMIN D 25 HYDROXY (VIT D DEFICIENCY, FRACTURES): VITD: 21.32 ng/mL — ABNORMAL LOW (ref 30.00–100.00)

## 2020-12-29 NOTE — Progress Notes (Signed)
NEUROLOGY FOLLOW UP OFFICE NOTE  Travis Castillo [id number] [birth year]  HISTORY OF PRESENT ILLNESS: I had the pleasure of seeing Travis Castillo in follow-up in the neurology clinic on 12/29/2020.  The patient was last seen a month ago for MS and presents for an earlier appointment to discuss medications. On his last visit, he reported new symptoms of significant body pain from his throat to his toes, left arm felt weaker. MRI brain with and without contrast done 11/20/20 showed a single subcetimeter incomplete right of enhancement at the juxtacortical white matter of the left frontal operculum, consistent with a small focus of active demyelination. Again were seen the patchy white matter changes in both hemispheres with multiple scattered corresponding T1 black holes. Patchy involvement of left midbrain and left cerebellum also seen. Exam is worsened and progressed compared to last scan in 2019 with increased size of several lesions. MRI cervical spine showed single subtle patchy focus in the central/dorsal aspect of cord at C2-3. He received 2 doses of IV Solumedrol 1000mg , however missed his third dose. He was called and reported he did not come because he felt better. He still has some balance issues but no falls. No further pain, left arm is stronger. No headaches, dizziness, vision changes. Mood and sleep are good.   HPI: This is a very pleasant 40 yo LH man with no significant past medical history who presented for evaluation of possible Lhermitte's sign and numbness last 02/2014. Symptoms started around May 2015 when he had numbness in the fingers of his right hand. After a week, it spread to his entire hand, then a week later, he noticed the numbness in his left hand. The episode lasted 2 weeks. Around June 2015, he noticed that every time he would flex his neck, he feels a sensation of vibration radiating from his neck down his back all the way to his feet. He denies any neck pain, but describes a  vibrating sensation in his neck and in his thighs Symptoms resolve when he lifts his head back to neutral position. He has occasional numbness his toes. He would get numbness in both legs when bending forward while sitting, which he attributed to positional changes. No facial involvement. He denies similar symptoms in the past. He denies any vision changes/loss of vision in the past. He is very active with basketball, weights, and cardio. He noticed that recently when he does push-ups, his bones feel a little more sore.   His MRI brain with and without contrast done July 2015 had shown extensive periventricular and scattered subcortical white matter changes bilaterally. There is asymmetric white matter disease adjacent to the atrium of the left lateral ventricle. There is extensive involvement of the colossoseptal margin. There is some diffusion signal abnormality within the area involving the right coronal radiata. A 7 x 6 x 2 mm focus of enhancement is associated with a white matter lesion in the anterior left frontal lobe. There are 2 punctate areas of enhancement associated with the area of signal abnormality in the right coronal radiata. MRI C-spine and T-spine normal, no abnormal signal or enhancement seen. He had a lumbar puncture which showed >5 oligoclonal bands. All other studies negative as noted previously. He did have a post-LP headache for several days but did not proceed with blood patch. Vitamin D level 40 I personally reviewed repeat MRI brain with and without contrast done 07/2015 was overall stable, showing extensive white matter changes bilaterally, 41mm lesion in the left midbrain, asymmetric  left-sided T2 changes in the cerebellum. There is extensive involvement of the colossal septal margin. The 71mm focal area of enhancement in the left frontal operculum is stable. No new lesions.  I personally reviewed MRI brain with and without contrast done 02/23/18, no new lesions compared to 2016  imaging, there were multiple periventricular greater than subcortical white matter lesions again seen, no abnormal enhancement. The largest black hole is in the left frontal subcortical white matter.    PAST MEDICAL HISTORY: Past Medical History:  Diagnosis Date  . MS (multiple sclerosis) (HCC)     MEDICATIONS: No current outpatient medications on file prior to visit.   No current facility-administered medications on file prior to visit.    ALLERGIES: Allergies  Allergen Reactions  . Mucinex [Guaifenesin Er] Other (See Comments)    Projectile vomiting    FAMILY HISTORY: Family History  Problem Relation Age of Onset  . Hypertension Father   . Diabetes Paternal Aunt     SOCIAL HISTORY: Social History   Socioeconomic History  . Marital status: Married    Spouse name: Not on file  . Number of children: Not on file  . Years of education: Not on file  . Highest education level: Not on file  Occupational History  . Not on file  Tobacco Use  . Smoking status: Current Every Day Smoker    Packs/day: 0.50    Types: Cigarettes  . Smokeless tobacco: Never Used  Vaping Use  . Vaping Use: Never used  Substance and Sexual Activity  . Alcohol use: Yes    Alcohol/week: 0.0 standard drinks    Comment: 1 wine cooler a day  . Drug use: No  . Sexual activity: Yes    Partners: Female  Other Topics Concern  . Not on file  Social History Narrative   Lives with wife and children in a two story home.     Works for [employer].   Right handed         Social Determinants of Health   Financial Resource Strain: Not on file  Food Insecurity: Not on file  Transportation Needs: Not on file  Physical Activity: Not on file  Stress: Not on file  Social Connections: Not on file  Intimate Partner Violence: Not on file     PHYSICAL EXAM: Vitals:   12/29/20 1145  BP: (!) 149/92  Pulse: 98  SpO2: 96%   General: No acute distress Head:   Normocephalic/atraumatic Skin/Extremities: No rash, no edema Neurological Exam: alert and awake. No aphasia or dysarthria. Fund of knowledge is appropriate.  Recent and remote memory are intact.  Attention and concentration are normal.   Cranial nerves: Pupils equal, round. Extraocular movements intact with no nystagmus. Visual fields full.  No facial asymmetry.  Motor: Bulk and tone normal, muscle strength 5/5 throughout with no pronator drift.   Finger to nose testing intact.  Gait narrow-based and steady, able to tandem walk adequately.  Romberg negative.   IMPRESSION: This is a very pleasant 40 yo LH man with no significant past medication history, who initially presented with paresthesias that lasted for 2 weeks that started in May 2015, then had symptoms suggestive of Lhermitte's sign in June 2015. His MRI brain in July 2015 showed extensive white matter changes bilaterally and in the callososeptal region, with small foci of abnormal enhancement in the left frontal and right corona radiata. CSF showed >5 oligoclonal bands. Findings consistent with a diagnosis of Multiple Sclerosis. He had injection site  side effects on Copaxone affecting compliance, and for one reason or another has not started Gilenya after repeated visits/calls to start it. Repeat MRI done 11/2019 showed progression since 2019 (he did not do annual MRI as instructed), with new enhancing lesion seen. He is interested in [place] as this may also help with compliance. Bloodwork will be done and we will get him started on infusion as soon as possible. Follow-up as scheduled in August, call for any changes.   Thank you for allowing me to participate in his care.  Please do not hesitate to call for any questions or concerns.   Patrcia Dolly, M.D.   CC: [facility]

## 2020-12-29 NOTE — Patient Instructions (Signed)
1. Bloodwork to initiate Ocrevus  2. We will start getting Ocrevus authorized  3. Follow-up as scheduled in August, call for any changes

## 2020-12-30 LAB — HEPATITIS B SURFACE ANTIBODY,QUALITATIVE: Hep B S Ab: NONREACTIVE

## 2020-12-30 LAB — IGG, IGA, IGM
IgG (Immunoglobin G), Serum: 1435 mg/dL (ref 600–1640)
IgM, Serum: 61 mg/dL (ref 50–300)
Immunoglobulin A: 148 mg/dL (ref 47–310)

## 2020-12-30 LAB — HEPATITIS B CORE ANTIBODY, TOTAL: Hep B Core Total Ab: NONREACTIVE

## 2021-01-04 ENCOUNTER — Other Ambulatory Visit: Payer: Self-pay | Admitting: Neurology

## 2021-01-04 MED ORDER — VITAMIN D (ERGOCALCIFEROL) 1.25 MG (50000 UNIT) PO CAPS: 50000.0000 [IU] | ORAL_CAPSULE | ORAL | 0 refills | Status: DC

## 2021-01-06 ENCOUNTER — Telehealth: Payer: Self-pay

## 2021-01-06 NOTE — Telephone Encounter (Signed)
Pt called and informed that bloodwork is all back so hopefully he can get set up with Ocrevus. His vitamin D level is very low, he will need to take high dose vitamin D once a week for 8 weeks pt verbalized understanding

## 2021-01-06 NOTE — Telephone Encounter (Signed)
-----   Message from Van Clines, MD sent at 01/04/2021 12:52 PM EDT ----- Pls let him know bloodwork is all back so hopefully he can get set up with Ocrevus. His vitamin D level is very low, he will need to take high dose vitamin D once a week for 8 weeks, I sent in Rx, thanks

## 2021-03-27 ENCOUNTER — Other Ambulatory Visit: Payer: Self-pay

## 2021-03-27 ENCOUNTER — Ambulatory Visit: Payer: 59 | Admitting: Neurology

## 2021-03-27 ENCOUNTER — Encounter: Payer: Self-pay | Admitting: Neurology

## 2021-03-27 VITALS — BP 141/83 | HR 87 | Ht 77.0 in | Wt 204.4 lb

## 2021-03-27 DIAGNOSIS — R350 Frequency of micturition: Secondary | ICD-10-CM | POA: Diagnosis not present

## 2021-03-27 DIAGNOSIS — G35 Multiple sclerosis: Secondary | ICD-10-CM | POA: Diagnosis not present

## 2021-03-27 DIAGNOSIS — E559 Vitamin D deficiency, unspecified: Secondary | ICD-10-CM

## 2021-03-27 MED ORDER — VITAMIN D (ERGOCALCIFEROL) 1.25 MG (50000 UNIT) PO CAPS: 50000.0000 [IU] | ORAL_CAPSULE | ORAL | 0 refills | Status: DC

## 2021-03-27 NOTE — Patient Instructions (Signed)
Start Ocrevus  2. Take high dose vitamin D replacement once a week for 8 weeks  3. Referral will be sent to Urology  4. Follow-up in 3-4 months, call for any changes

## 2021-03-27 NOTE — Progress Notes (Signed)
NEUROLOGY FOLLOW UP OFFICE NOTE  Travis Castillo [id number] Aug 23, 1980  HISTORY OF PRESENT ILLNESS: I had the pleasure of seeing Travis Castillo in follow-up in the neurology clinic on 03/27/2021.  The patient was last seen 3 months ago for MS. On his last visit, we had discussed MRI brain in 11/2020 showing progression compared to 2019. He had injection site reactions to Copaxone, we had had repeated discussions about the need for disease modifying agents with his follow-up visits over the years however he has had difficulties with compliance. We had discussed Ocrevus on his last visit since this would hopefully help with compliance, however he has not followed up with his Ocrevus contact to set up initial infusions. He has been having a lot more dizzy spells. They were at the grocery and he got so fatigued he had to use the cart. He has also noticed urinary frequency and urgency, no incontinence. He denies any new numbness/tingling, no vision changes or headaches. His balance is "not there," no falls. His wife is doing more of the driving now, he is noticing difficulties with catching up with everything/so much activity, he has to drive slow on the right lane. He has not been sleeping well, waking up every 4 hours. He feels tired and is hesitant to take melatonin. He has changed his diet and has been working out, giving him more energy but overall feels tired and would crash after several days of his diet. His vitamin D level was low at 21.32 last 12/2020, he did not take the replacement Rx sent.    HPI:  This is a very pleasant 40 yo LH man with no significant past medical history who presented for evaluation of possible Lhermitte's sign and numbness last 02/2014. Symptoms started around May 2015 when he had numbness in the fingers of his right hand. After a week, it spread to his entire hand, then a week later, he noticed the numbness in his left hand. The episode lasted 2 weeks. Around June 2015, he noticed  that every time he would flex his neck, he feels a sensation of vibration radiating from his neck down his back all the way to his feet. He denies any neck pain, but describes a vibrating sensation in his neck and in his thighs Symptoms resolve when he lifts his head back to neutral position. He has occasional numbness his toes. He would get numbness in both legs when bending forward while sitting, which he attributed to positional changes. No facial involvement. He denies similar symptoms in the past. He denies any vision changes/loss of vision in the past. He is very active with basketball, weights, and cardio. He noticed that recently when he does push-ups, his bones feel a little more sore.   His MRI brain with and without contrast done July 2015 had shown extensive periventricular and scattered subcortical white matter changes bilaterally. There is asymmetric white matter disease adjacent to the atrium of the left lateral ventricle. There is extensive involvement of the colossoseptal margin. There is some diffusion signal abnormality within the area involving the right coronal radiata. A 7 x 6 x 2 mm focus of enhancement is associated with a white matter lesion in the anterior left frontal lobe. There are 2 punctate areas of enhancement associated with the area of signal abnormality in the right coronal radiata. MRI C-spine and T-spine normal, no abnormal signal or enhancement seen. He had a lumbar puncture which showed >5 oligoclonal bands. All other studies negative as noted  previously.  He did have a post-LP headache for several days but did not proceed with blood patch. Vitamin D level 40 I personally reviewed repeat MRI brain with and without contrast done 07/2015 was overall stable, showing extensive white matter changes bilaterally, 85mm lesion in the left midbrain, asymmetric left-sided T2 changes in the cerebellum. There is extensive involvement of the colossal septal margin. The 41mm focal area of  enhancement in the left frontal operculum is stable. No new lesions.  I personally reviewed MRI brain with and without contrast done 02/23/18, no new lesions compared to 2016 imaging, there were multiple periventricular greater than subcortical white matter lesions again seen, no abnormal enhancement. The largest black hole is in the left frontal subcortical white matter.    PAST MEDICAL HISTORY: Past Medical History:  Diagnosis Date   MS (multiple sclerosis) (HCC)     MEDICATIONS: No current outpatient medications on file prior to visit.   No current facility-administered medications on file prior to visit.    ALLERGIES: Allergies  Allergen Reactions   Mucinex [Guaifenesin Er] Other (See Comments)    Projectile vomiting    FAMILY HISTORY: Family History  Problem Relation Age of Onset   Hypertension Father    Diabetes Paternal Aunt     SOCIAL HISTORY: Social History   Socioeconomic History   Marital status: Married    Spouse name: Not on file   Number of children: Not on file   Years of education: Not on file   Highest education level: Not on file  Occupational History   Not on file  Tobacco Use   Smoking status: Every Day    Packs/day: 0.50    Types: Cigarettes   Smokeless tobacco: Never  Vaping Use   Vaping Use: Never used  Substance and Sexual Activity   Alcohol use: Yes    Alcohol/week: 0.0 standard drinks    Comment: 1 wine cooler a day   Drug use: No   Sexual activity: Yes    Partners: Female  Other Topics Concern   Not on file  Social History Narrative   Lives with wife and children in a two story home.     Works for [employer].   Left handed         Social Determinants of Health   Financial Resource Strain: Not on file  Food Insecurity: Not on file  Transportation Needs: Not on file  Physical Activity: Not on file  Stress: Not on file  Social Connections: Not on file  Intimate Partner Violence: Not on file     PHYSICAL  EXAM: Vitals:   03/27/21 1536  BP: (!) 141/83  Pulse: 87  SpO2: 99%   General: No acute distress, tired-appearing Head:  Normocephalic/atraumatic Skin/Extremities: No rash, no edema Neurological Exam: alert and awake. No aphasia or dysarthria. Fund of knowledge is appropriate.  Recent and remote memory are intact.  Attention and concentration are normal.   Cranial nerves: Pupils equal, round. Extraocular movements intact with no nystagmus. Visual fields full.  No facial asymmetry.  Motor: Bulk and tone normal, muscle strength 5/5 throughout with no pronator drift.   Finger to nose testing intact.  Gait narrow-based and steady, able to tandem walk adequately.  Romberg negative.   IMPRESSION: This is a very pleasant 40 yo LH man with no significant past medical history, who initially presented with paresthesias that lasted for 2 weeks that started in May 2015, then had symptoms suggestive of Lhermitte's sign in June 2015.  His MRI brain in July 2015 showed extensive white matter changes bilaterally and in the callososeptal region, with small foci of abnormal enhancement in the left frontal and right corona radiata. CSF showed >5 oligoclonal bands.  Findings consistent with a diagnosis of Multiple Sclerosis. He had injection site side effects on Copaxone affecting compliance, over the years we have tried to get him on Gilenya but he has difficulty with compliance. Ocrevus which is every 6 months would be good for him, he was again encouraged to proceed as previously discussed, as his repeat MRI in 11/2020 showed progression since 2019 with new enhancing lesion seen. He is noticing more fatigue and urinary frequency. He will be referred to Urology for urinary frequency in the setting of MS. We discussed fatigue and low vitamin D, advised to start replacement therapy. Follow-up in 3-4 months, call for any changes.    Thank you for allowing me to participate in his care.  Please do not hesitate to call for  any questions or concerns.  Patrcia Dolly, M.D.   CC: [facility]

## 2021-07-25 ENCOUNTER — Telehealth (INDEPENDENT_AMBULATORY_CARE_PROVIDER_SITE_OTHER): Payer: 59 | Admitting: Neurology

## 2021-07-25 ENCOUNTER — Encounter: Payer: Self-pay | Admitting: Neurology

## 2021-07-25 ENCOUNTER — Other Ambulatory Visit: Payer: Self-pay

## 2021-07-25 ENCOUNTER — Telehealth: Payer: Self-pay

## 2021-07-25 VITALS — Ht 77.0 in | Wt 192.0 lb

## 2021-07-25 DIAGNOSIS — R400 Somnolence: Secondary | ICD-10-CM | POA: Diagnosis not present

## 2021-07-25 DIAGNOSIS — G35 Multiple sclerosis: Secondary | ICD-10-CM | POA: Diagnosis not present

## 2021-07-25 NOTE — Patient Instructions (Signed)
Hi Zacari,  This is our to-do list:  Call Ocrevus to set-up your first infusion (please! You can do this!)  2. Schedule home sleep study  3. Follow-up with your eye doctor  4. Discuss frequent urination and left elbow pain with PCP (consider Urology evaluation for urinary issues)  5. Follow-up in 3-4 months, call for any changes

## 2021-07-25 NOTE — Progress Notes (Signed)
Virtual Visit via Video Note The purpose of this virtual visit is to provide medical care while limiting exposure to the novel coronavirus.    Consent was obtained for video visit:  Yes.   Answered questions that patient had about telehealth interaction:  Yes.   I discussed the limitations, risks, security and privacy concerns of performing an evaluation and management service by telemedicine. I also discussed with the patient that there may be a patient responsible charge related to this service. The patient expressed understanding and agreed to proceed.  Pt location: Private vehicle Physician Location: office Name of referring provider:  [facility] I connected with Travis Castillo at patients initiation/request on 07/25/2021 at  2:30 PM EST by video enabled telemedicine application and verified that I am speaking with the correct person using two identifiers. Pt MRN:  [id number] Pt DOB:  10/01/1980 Video Participants:  Travis Castillo   History of Present Illness:  The patient was seen as a virtual video visit on 07/25/2021. He was last seen in the neurology clinic 4 months ago for MS. He requested for a video visit today due to COVID infection, he is recovering well. Since his last visit, he still has not started Ocrevus infusions. He states that it is "self-inflicted," but he is mentally ready now to start. He received a voicemail from [facility] 3 months ago and plans to call them back. He reports a lot of fatigue. He gets 8 hours of sleep but still feels like he only got 4 hours. His wife reports very loud snoring, he has daytime drowsiness. He has urinary frequency, needing to urinate every 1-2 hours, he thinks he has spoken to his PCP at [facility] and may have discussed Urology evaluation. He feels his vision is going a little bit, no vision loss. He notices his depth perception is a little off, he sees the wall but still hits it. He denies any headaches, dizziness, focal  numbness/tingling/weakness. He reports left elbow pain for the past 6 months, making it difficult to do push-ups.   HPI:  This is a very pleasant 40 yo LH man with no significant past medical history who presented for evaluation of possible Lhermitte's sign and numbness last 02/2014. Symptoms started around May 2015 when he had numbness in the fingers of his right hand. After a week, it spread to his entire hand, then a week later, he noticed the numbness in his left hand. The episode lasted 2 weeks. Around June 2015, he noticed that every time he would flex his neck, he feels a sensation of vibration radiating from his neck down his back all the way to his feet. He denies any neck pain, but describes a vibrating sensation in his neck and in his thighs Symptoms resolve when he lifts his head back to neutral position. He has occasional numbness his toes. He would get numbness in both legs when bending forward while sitting, which he attributed to positional changes. No facial involvement. He denies similar symptoms in the past. He denies any vision changes/loss of vision in the past. He is very active with basketball, weights, and cardio. He noticed that recently when he does push-ups, his bones feel a little more sore.   His MRI brain with and without contrast done July 2015 had shown extensive periventricular and scattered subcortical white matter changes bilaterally. There is asymmetric white matter disease adjacent to the atrium of the left lateral ventricle. There is extensive involvement of the colossoseptal margin.  There is some diffusion signal abnormality within the area involving the right coronal radiata. A 7 x 6 x 2 mm focus of enhancement is associated with a white matter lesion in the anterior left frontal lobe. There are 2 punctate areas of enhancement associated with the area of signal abnormality in the right coronal radiata. MRI C-spine and T-spine normal, no abnormal signal or enhancement seen.  He had a lumbar puncture which showed >5 oligoclonal bands. All other studies negative as noted previously.  He did have a post-LP headache for several days but did not proceed with blood patch.  Vitamin D level 40  Repeat MRI brain with and without contrast done 07/2015 was overall stable, showing extensive white matter changes bilaterally, 87mm lesion in the left midbrain, asymmetric left-sided T2 changes in the cerebellum. There is extensive involvement of the colossal septal margin. The 18mm focal area of enhancement in the left frontal operculum is stable. No new lesions.   MRI brain with and without contrast done 02/23/18: no new lesions compared to 2016 imaging, there were multiple periventricular greater than subcortical white matter lesions again seen, no abnormal enhancement. The largest black hole is in the left frontal subcortical white matter.   MRI brain and cervical spine with and without contrast 11/2020: Patchy multifocal T2/FLAIR hyperintensities involving the supratentorial and infratentorial brain, consistent with history of multiple sclerosis. Overall, appearance is mildly progressed and worsened as compared to most recent available brain MRI from 02/23/2018. There is a single subcentimeter focus of enhancement involving a juxtacortical lesion at the left frontal operculum, consistent with active demyelination. There is a Single focus of cord signal abnormality involving the dorsal cord at the level of C2-3, consistent with demyelinating disease. This appears to be new as compared to most recent cervical spine MRI from 2015, consistent with disease progression. No evidence for active demyelination.    No current outpatient medications on file prior to visit.   No current facility-administered medications on file prior to visit.     Observations/Objective:   Vitals:   07/25/21 1338  Weight: 192 lb (87.1 kg)  Height: 6\' 5"  (1.956 m)   GEN:  The patient appears stated age and is  in NAD.  Neurological examination: Patient is awake, alert. No aphasia or dysarthria. Intact fluency and comprehension.  Cranial nerves: Extraocular movements intact with no nystagmus. No facial asymmetry. Motor: moves all extremities symmetrically, at least anti-gravity x 4.    Assessment and Plan:   This is a very pleasant 40 yo LH man with no significant past medical history, who initially presented with paresthesias that lasted for 2 weeks that started in May 2015, then had symptoms suggestive of Lhermitte's sign in June 2015. His MRI brain in July 2015 showed extensive white matter changes bilaterally and in the callososeptal region, with small foci of abnormal enhancement in the left frontal and right corona radiata. CSF showed >5 oligoclonal bands.  Findings consistent with a diagnosis of Multiple Sclerosis. He had injection site side effects on Copaxone affecting compliance,  and over the years we have repeatedly tried to start a different disease modifying agent however he still has not started any. We again discussed MRI in 11/2020 showing progression and how medications can help, he states he is now ready to start Ocrevus and will contact them back to get it started. He reports fatigue, daytime drowsiness, and loud snoring, home sleep study will be ordered to assess for sleep apnea. He also reports vision and urinary  changes, advised to follow-up with eye doctor and Urology. Follow-up in 3-4 months, call for any changes.    Follow Up Instructions:   -I discussed the assessment and treatment plan with the patient. The patient was provided an opportunity to ask questions and all were answered. The patient agreed with the plan and demonstrated an understanding of the instructions.   The patient was advised to call back or seek an in-person evaluation if the symptoms worsen or if the condition fails to improve as anticipated.    Van Clines, MD

## 2021-07-25 NOTE — Telephone Encounter (Signed)
I had to leave message for patients plan to call back regarding prior authorization for home sleep test cpt [phone]. Npi number [id number], [phone]. Hopefully to hear back,.

## 2021-10-23 ENCOUNTER — Ambulatory Visit: Payer: 59 | Admitting: Neurology

## 2021-12-26 ENCOUNTER — Ambulatory Visit: Payer: 59 | Admitting: Neurology

## 2022-08-26 IMAGING — MR MR HEAD WO/W CM
13 series · 48 of 48 positions shown · IV contrast (multihance)
Comparison: Previous MRIs from 02/23/2018 and 02/01/2014

CLINICAL DATA: Follow-up examination for multiple sclerosis.

EXAM:
MRI HEAD WITHOUT AND WITH CONTRAST
MRI CERVICAL SPINE WITHOUT AND WITH CONTRAST
TECHNIQUE: Multiplanar, multiecho pulse sequences of the brain and surrounding
structures, and cervical spine, to include the craniocervical
junction and cervicothoracic junction, were obtained without and
with intravenous contrast.
CONTRAST:  20mL MULTIHANCE GADOBENATE DIMEGLUMINE 529 MG/ML IV SOLN

[Series 2: T1 · sagittal · 5.0mm · 0.45mm/px · 2 of 21 slices shown]
[im 1/21]
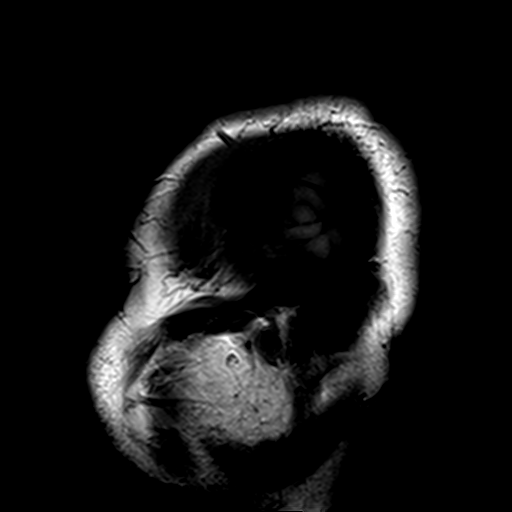
[im 21/21]
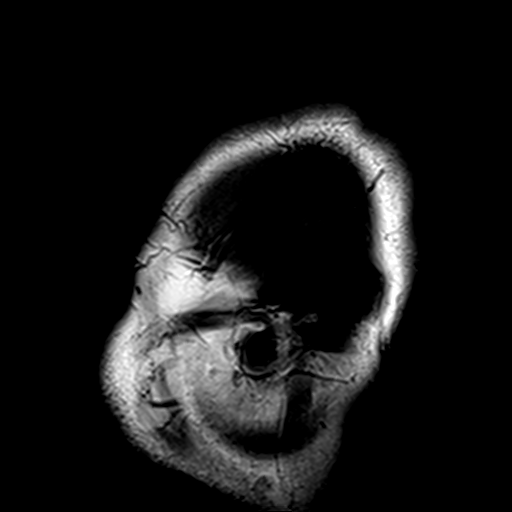

[Series 3: DWI · axial · 3.0mm · 1.80mm/px · z∈[-70,+76]mm · 7 of 99 slices shown (1 of 4)]
[im 1/99]
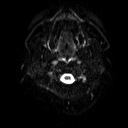
[im 17/99]
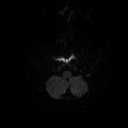
[im 33/99]
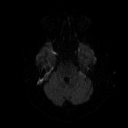
[im 50/99]
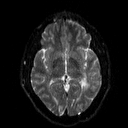
[im 66/99]
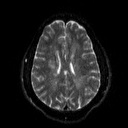
[im 82/99]
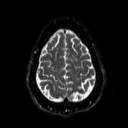
[im 99/99]
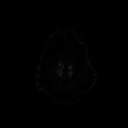

[Series 4: DWI · axial · 3.0mm · 1.80mm/px · z∈[-70,+76]mm · 3 of 45 slices shown (2 of 4)]
[im 1/45]
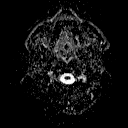
[im 23/45]
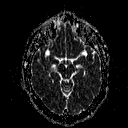
[im 45/45]
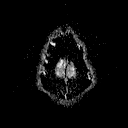

[Series 5: DWI · coronal · 5.0mm · 1.80mm/px · 4 of 68 slices shown (3 of 4)]
[im 1/68]
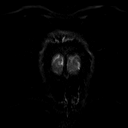
[im 23/68]
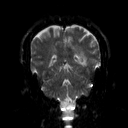
[im 45/68]
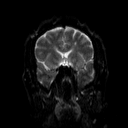
[im 68/68]
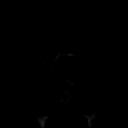

[Series 6: DWI · coronal · 5.0mm · 1.80mm/px · 2 of 34 slices shown (4 of 4)]
[im 1/34]
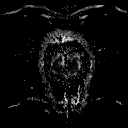
[im 34/34]
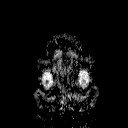

[Series 7: T2 · axial · 5.0mm · 0.60mm/px · 1 of 23 slices shown (1 of 2)]
[im 1/23]
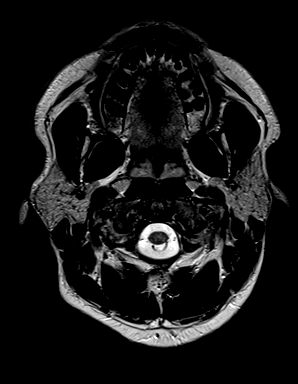

[Series 8: FLAIR · axial · 3.0mm · 0.45mm/px · z∈[-69,+75]mm · 2 of 32 slices shown]
[im 1/32]
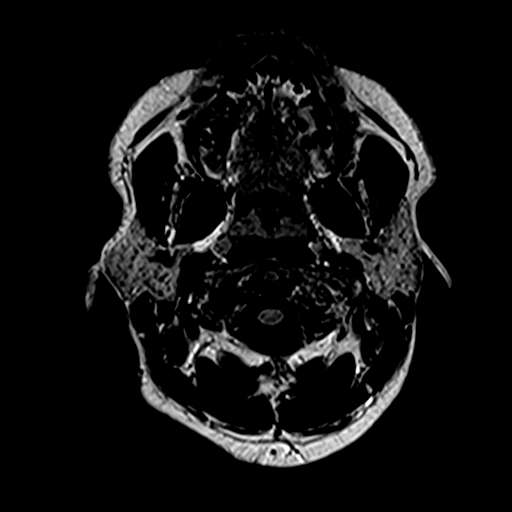
[im 32/32]
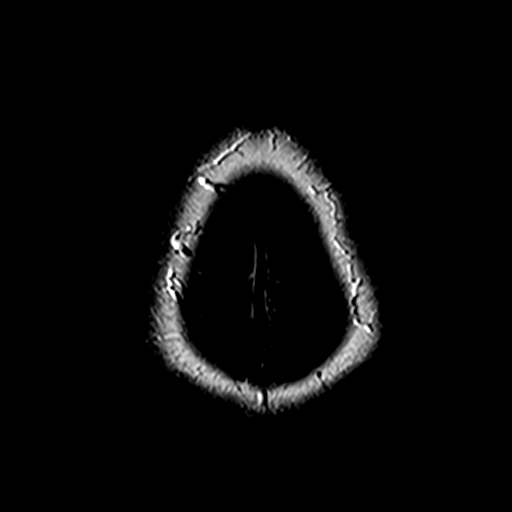

[Series 9: mip_images(sw) · axial · 32.0mm · 0.90mm/px · z∈[-61,+67]mm · 2 of 33 slices shown]
[im 1/33]
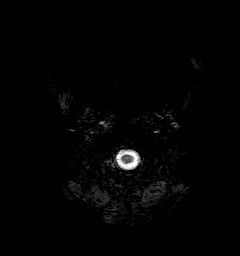
[im 33/33]
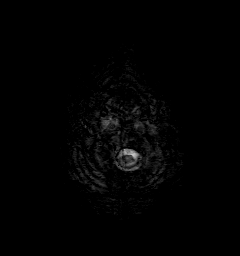

[Series 10: swi_images · axial · 4.0mm · 0.90mm/px · z∈[-75,+81]mm · 3 of 40 slices shown]
[im 1/40]
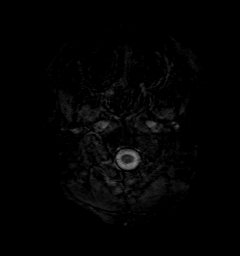
[im 20/40]
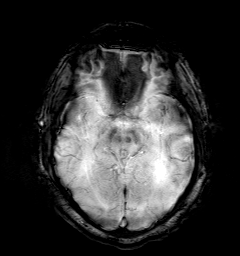
[im 40/40]
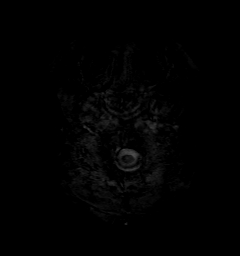

[Series 11: t1_mpr_tra · axial · 1.0mm · 0.75mm/px · z∈[-67,+75]mm · 9 of 144 slices shown]
[im 1/144]
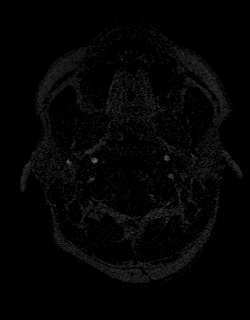
[im 18/144]
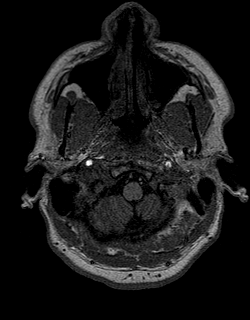
[im 36/144]
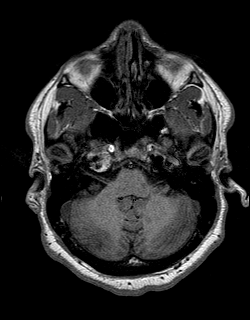
[im 54/144]
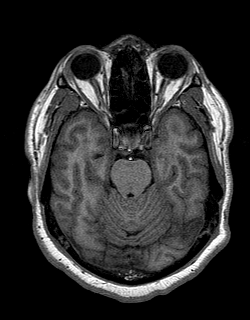
[im 72/144]
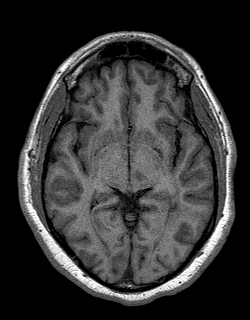
[im 90/144]
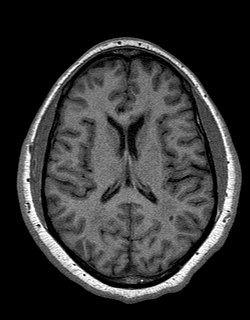
[im 108/144]
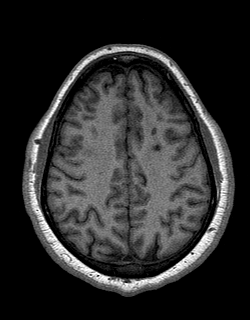
[im 126/144]
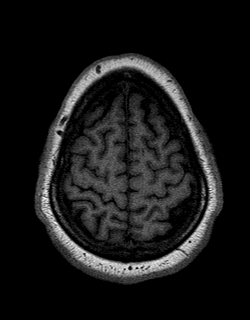
[im 144/144]
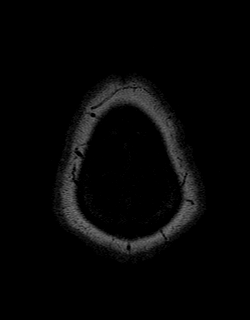

[Series 12: T2 · coronal · 5.0mm · 0.45mm/px · 2 of 29 slices shown (2 of 2)]
[im 1/29]
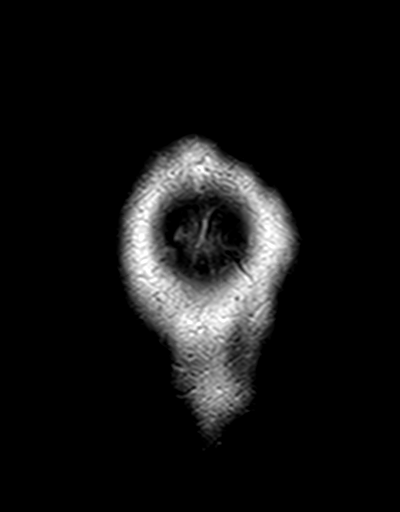
[im 29/29]
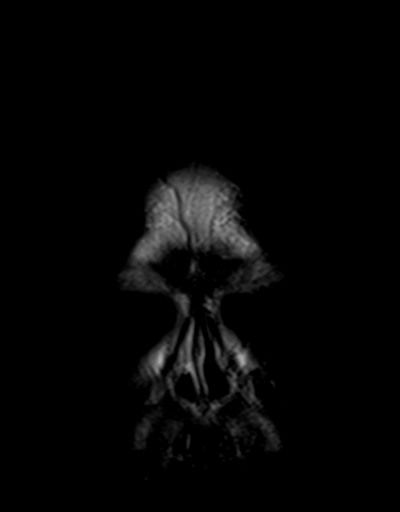

[Series 13: t1_mpr_tra post · axial · 1.0mm · 0.75mm/px · z∈[-67,+75]mm · 9 of 144 slices shown]
[im 1/144]
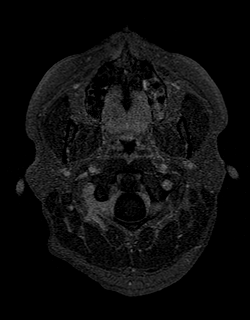
[im 18/144]
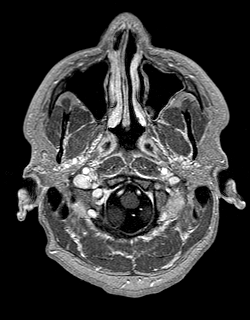
[im 36/144]
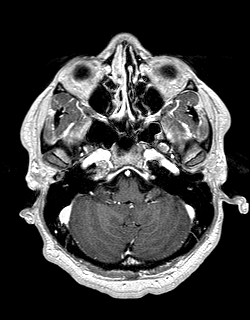
[im 54/144]
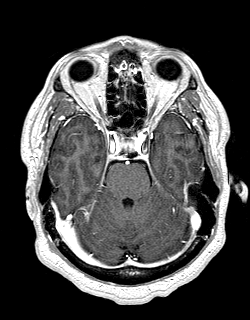
[im 72/144]
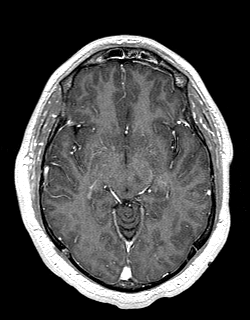
[im 90/144]
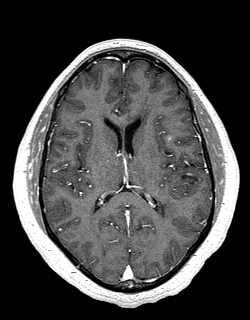
[im 108/144]
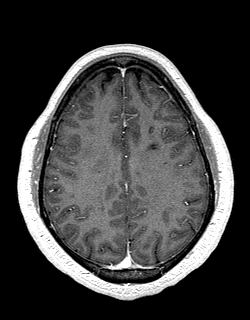
[im 126/144]
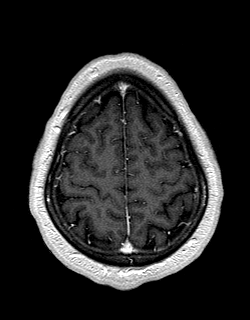
[im 144/144]
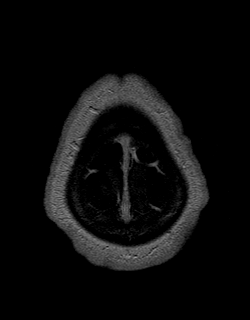

[Series 14: post cor · coronal · 5.0mm · 0.45mm/px · 2 of 29 slices shown]
[im 1/29]
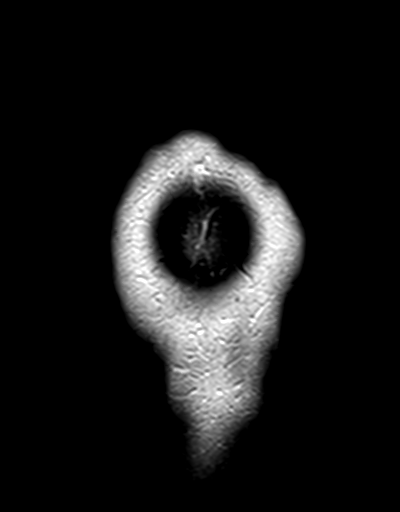
[im 29/29]
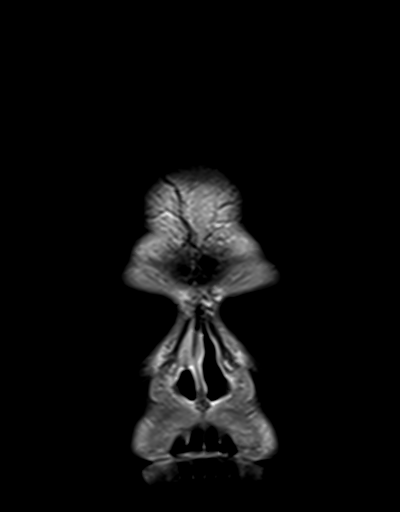

[48 of 48 positions shown; findings below may reference images not displayed]

FINDINGS: MRI HEAD FINDINGS

Brain: Cerebral volume stable, and remains within normal limits for
age. Again seen are patchy multifocal T2/FLAIR hyperintensities
involving the periventricular, deep, and juxta cortical white matter
of both cerebral hemispheres, consistent with history of multiple
sclerosis. Multiple scattered corresponding T1 black holes. Patchy
involvement at the left midbrain and left cerebellum noted. In
comparison with previous exam, overall appearance appears somewhat
worsened and progressed, with increased size of several lesions as
compared to previous (7 mm lesion at the anterior left frontal
periventricular white matter for example - series 8, image 24).
There is a single subcentimeter incomplete ring of enhancement at
the juxta cortical white matter of the left frontal operculum,
consistent with a small focus of active demyelination (series 13,
image 90). Small focus of associated susceptibility artifact noted
as well (series 10, image 25). No other evidence for active
demyelination.

No evidence for acute or subacute ischemia. Gray-white matter
differentiation maintained. No encephalomalacia to suggest chronic
cortical infarction. No other evidence for acute or chronic
intracranial hemorrhage.

No mass lesion, midline shift or mass effect. No hydrocephalus or
extra-axial fluid collection. Pituitary gland suprasellar region
within normal limits. Midline structures intact. No other abnormal
enhancement.

Vascular: Major intracranial vascular flow voids are maintained.

Skull and upper cervical spine: Craniocervical junction normal. Bone
marrow signal intensity within normal limits. No scalp soft tissue
abnormality.

Sinuses/Orbits: Globes and orbital soft tissues within normal
limits. Scattered mucosal thickening noted throughout the paranasal
sinuses. No significant mastoid effusion. Inner ear structures
grossly normal.

Other: None.

MRI CERVICAL SPINE FINDINGS

Alignment: Vertebral bodies normally aligned with preservation of
the normal cervical lordosis. No listhesis.

Vertebrae: Vertebral body height maintained without acute or chronic
fracture. Bone marrow signal intensity within normal limits.
Subcentimeter benign hemangioma noted within the T1 vertebral body.
No other discrete or worrisome osseous lesions. No abnormal marrow
edema or enhancement.

Cord: Single subtle patchy focus of signal abnormality seen
involving the central/dorsal aspect of the cord at the level of C2-3
(series 5, image 4). This appears to be new from previous.
Otherwise, signal intensity within the cervical spinal cord is
normal, with no other signal changes to suggest demyelinating
disease. No abnormal enhancement to suggest active demyelination. No
significant cord atrophy.

Posterior Fossa, vertebral arteries, paraspinal tissues:
Craniocervical junction within normal limits. Paraspinous and
prevertebral soft tissues are normal. Normal flow voids seen within
the vertebral arteries bilaterally.

Disc levels: Minimal noncompressive disc bulging noted at C3-4
through C6-7 without significant spinal stenosis. Foramina remain
patent.
IMPRESSION: MRI HEAD IMPRESSION:

1. Patchy multifocal T2/FLAIR hyperintensities involving the
supratentorial and infratentorial brain, consistent with history of
multiple sclerosis. Overall, appearance is mildly progressed and
worsened as compared to most recent available brain MRI from
02/23/2018.
[DATE]. Single subcentimeter focus of enhancement involving a juxta
cortical lesion at the left frontal operculum, consistent with
active demyelination.
3. No other acute intracranial abnormality.

MRI CERVICAL SPINE IMPRESSION:

1. Single focus of cord signal abnormality involving the dorsal cord
at the level of C2-3, consistent with demyelinating disease. This
appears to be new as compared to most recent cervical spine MRI from
9648, consistent with disease progression. No evidence for active
demyelination.
2. Minor multilevel degenerative spondylosis without significant
stenosis or neural impingement.

## 2023-09-16 ENCOUNTER — Telehealth: Payer: Self-pay | Admitting: Neurology

## 2023-09-16 DIAGNOSIS — G35 Multiple sclerosis: Secondary | ICD-10-CM

## 2023-09-16 NOTE — Telephone Encounter (Signed)
 Pt would like transfer of care to  [facility], [place]  Multiple Sclerosis, Neurology  [address], [place] [address] Phone number [phone]  He has moved to [place],.

## 2023-09-16 NOTE — Telephone Encounter (Signed)
Ok to send referral, thanks 

## 2023-09-16 NOTE — Telephone Encounter (Signed)
 Caller stated he would like to know if Dr Ty Gales can refer him to a different neurologist in his area. Stated the drive is far and scheduling time is too far out for him.

## 2023-09-17 NOTE — Telephone Encounter (Signed)
Referral faxed with noted

## 2023-11-05 ENCOUNTER — Telehealth: Payer: Self-pay | Admitting: Neurology

## 2023-11-05 ENCOUNTER — Other Ambulatory Visit: Payer: Self-pay

## 2023-11-05 DIAGNOSIS — G35 Multiple sclerosis: Secondary | ICD-10-CM

## 2023-11-05 NOTE — Telephone Encounter (Signed)
 Travis Castillo referral to [facility] Fax# [phone]

## 2023-11-05 NOTE — Telephone Encounter (Signed)
 Referral faxed to [facility] Fax# [phone]

## 2023-11-07 DIAGNOSIS — M7702 Medial epicondylitis, left elbow: Secondary | ICD-10-CM | POA: Diagnosis not present

## 2023-11-07 DIAGNOSIS — M7701 Medial epicondylitis, right elbow: Secondary | ICD-10-CM | POA: Diagnosis not present

## 2023-11-08 DIAGNOSIS — M7701 Medial epicondylitis, right elbow: Secondary | ICD-10-CM | POA: Diagnosis not present

## 2023-11-22 DIAGNOSIS — M7701 Medial epicondylitis, right elbow: Secondary | ICD-10-CM | POA: Diagnosis not present
